# Patient Record
Sex: Male | Born: 1960
Health system: Southern US, Community
[De-identification: ages and names within clinical notes are randomized; demographics above are authoritative.]

## PROBLEM LIST (undated history)

## (undated) DIAGNOSIS — K635 Polyp of colon: Secondary | ICD-10-CM

## (undated) DIAGNOSIS — E669 Obesity, unspecified: Secondary | ICD-10-CM

## (undated) DIAGNOSIS — K219 Gastro-esophageal reflux disease without esophagitis: Secondary | ICD-10-CM

## (undated) DIAGNOSIS — E739 Lactose intolerance, unspecified: Secondary | ICD-10-CM

## (undated) DIAGNOSIS — T7840XA Allergy, unspecified, initial encounter: Secondary | ICD-10-CM

## (undated) HISTORY — DX: Obesity, unspecified: E66.9

## (undated) HISTORY — DX: Allergy, unspecified, initial encounter: T78.40XA

## (undated) HISTORY — PX: CERVICAL DISC SURGERY: SHX588

## (undated) HISTORY — DX: Polyp of colon: K63.5

## (undated) HISTORY — DX: Lactose intolerance, unspecified: E73.9

## (undated) HISTORY — PX: INGUINAL HERNIA REPAIR: SHX194

## (undated) HISTORY — PX: TOOTH EXTRACTION: SUR596

## (undated) HISTORY — DX: Gastro-esophageal reflux disease without esophagitis: K21.9

---

## 1996-10-29 HISTORY — PX: OTHER SURGICAL HISTORY: SHX169

## 2004-10-29 HISTORY — PX: LUMBAR DISC SURGERY: SHX700

## 2008-08-11 ENCOUNTER — Encounter: Payer: Self-pay | Admitting: Orthopedic Surgery

## 2008-12-08 ENCOUNTER — Ambulatory Visit: Payer: Self-pay | Admitting: Family Medicine

## 2008-12-08 DIAGNOSIS — M2390 Unspecified internal derangement of unspecified knee: Secondary | ICD-10-CM | POA: Insufficient documentation

## 2008-12-08 DIAGNOSIS — J309 Allergic rhinitis, unspecified: Secondary | ICD-10-CM | POA: Insufficient documentation

## 2008-12-24 ENCOUNTER — Ambulatory Visit: Payer: Self-pay | Admitting: Family Medicine

## 2008-12-28 ENCOUNTER — Ambulatory Visit: Payer: Self-pay | Admitting: Family Medicine

## 2008-12-28 LAB — CONVERTED CEMR LAB
Albumin: 4.2 g/dL (ref 3.5–5.2)
BUN: 14 mg/dL (ref 6–23)
Basophils Absolute: 0 10*3/uL (ref 0.0–0.1)
Basophils Relative: 0.7 % (ref 0.0–3.0)
Cholesterol: 170 mg/dL (ref 0–200)
Creatinine, Ser: 0.9 mg/dL (ref 0.4–1.5)
Eosinophils Absolute: 0.1 10*3/uL (ref 0.0–0.7)
Eosinophils Relative: 2.1 % (ref 0.0–5.0)
GFR calc Af Amer: 116 mL/min
GFR calc non Af Amer: 96 mL/min
HCT: 44.7 % (ref 39.0–52.0)
HDL: 47.2 mg/dL (ref >39.0)
MCHC: 33.6 g/dL (ref 30.0–36.0)
MCV: 89.2 fL (ref 78.0–100.0)
Monocytes Absolute: 0.5 10*3/uL (ref 0.1–1.0)
PSA: 2.08 ng/mL (ref 0.10–4.00)
Platelets: 208 10*3/uL (ref 150–400)
RBC: 5.02 M/uL (ref 4.22–5.81)
TSH: 1.38 microintl units/mL (ref 0.35–5.50)
WBC: 5.9 10*3/uL (ref 4.5–10.5)

## 2010-01-05 ENCOUNTER — Ambulatory Visit: Payer: Self-pay | Admitting: Family Medicine

## 2010-01-09 ENCOUNTER — Ambulatory Visit: Payer: Self-pay | Admitting: Family Medicine

## 2010-01-09 DIAGNOSIS — R5381 Other malaise: Secondary | ICD-10-CM | POA: Insufficient documentation

## 2010-01-09 DIAGNOSIS — R5383 Other fatigue: Secondary | ICD-10-CM

## 2010-01-10 LAB — CONVERTED CEMR LAB
AST: 20 units/L (ref 0–37)
Alkaline Phosphatase: 66 units/L (ref 39–117)
Basophils Absolute: 0 10*3/uL (ref 0.0–0.1)
Bilirubin, Direct: 0.1 mg/dL (ref 0.0–0.3)
Cholesterol: 155 mg/dL (ref 0–200)
GFR calc non Af Amer: 95.57 mL/min (ref >60)
HCT: 41.4 % (ref 39.0–52.0)
LDL Cholesterol: 82 mg/dL (ref 0–99)
Lymphs Abs: 1.9 10*3/uL (ref 0.7–4.0)
Monocytes Absolute: 0.6 10*3/uL (ref 0.1–1.0)
Monocytes Relative: 9.2 % (ref 3.0–12.0)
PSA: 2.03 ng/mL (ref 0.10–4.00)
Platelets: 184 10*3/uL (ref 150.0–400.0)
Potassium: 4.2 meq/L (ref 3.5–5.1)
RDW: 11.4 % — ABNORMAL LOW (ref 11.5–14.6)
Sodium: 141 meq/L (ref 135–145)
TSH: 1.79 microintl units/mL (ref 0.35–5.50)
Total Bilirubin: 0.5 mg/dL (ref 0.3–1.2)
VLDL: 22.6 mg/dL (ref 0.0–40.0)

## 2010-04-06 ENCOUNTER — Ambulatory Visit: Payer: Self-pay | Admitting: Family Medicine

## 2010-04-06 DIAGNOSIS — M7150 Other bursitis, not elsewhere classified, unspecified site: Secondary | ICD-10-CM

## 2010-04-06 DIAGNOSIS — M722 Plantar fascial fibromatosis: Secondary | ICD-10-CM | POA: Insufficient documentation

## 2010-04-18 ENCOUNTER — Ambulatory Visit: Payer: Self-pay | Admitting: Family Medicine

## 2010-11-28 NOTE — Assessment & Plan Note (Signed)
Summary: LOOK AT KNEE/DLO   Vital Signs:  Patient profile:   50 year old male Height:      71 inches Weight:      219.50 pounds BMI:     30.72 Temp:     98.8 degrees F oral Pulse rate:   92 / minute Pulse rhythm:   regular BP sitting:   116 / 70  (left arm) Cuff size:   large  Vitals Entered By: Delilah Shan CMA Duncan Dull) (April 06, 2010 12:08 PM) CC: Knee and heel pain (tennis injury)   History of Present Illness: 50 year old male:  Knee   Heel pain:  R knee: First week of May, hurt his right knee in her right knee. was hurting a lot on the inside.  Also, hurt bad a lot with bending his knee back a lot. Right knee is bending and hurting a lot   he does have an effusion, and continues to have some medial pain, this is much better over the last 1-2 weeks. He describes it at the joint line. He has been able to play tennis, including last night. He did play some golf as well. No mechanical symptoms. No locking up of this joint.  Heel, posterior on the left. for two monhs. deep ache, left-sided heel.  No specific injury or trauma.  during on ematch on the two months ago.  REVIEW OF SYSTEMS  GEN: No systemic complaints, no fevers, chills, sweats, or other acute illnesses MSK: Detailed in the HPI GI: tolerating PO intake without difficulty Neuro: No numbness, parasthesias, or tingling associated. Otherwise the pertinent positives of the ROS are noted above.      GEN: Well-developed,well-nourished,in no acute distress; alert,appropriate and cooperative throughout examination HEENT: Normocephalic and atraumatic without obvious abnormalities. No apparent alopecia or balding. Ears, externally no deformities PULM: Breathing comfortably in no respiratory distress EXT: No clubbing, cyanosis, or edema PSYCH: Normally interactive. Cooperative during the interview. Pleasant. Friendly and conversant. Not anxious or depressed appearing. Normal, full affect.   right knee: Mild to  moderate effusion. Full extension and flexion to 100.5. Tender on the medial, posterior joint line. Stable varus and valgus stress. Negative Lockman exam. Positive Bounce Home Test. The Murray's test is mildly painful, but no mechanical symptoms.  Left knee, grossly normal to examination with no meniscal examination findings.  Left foot and ankle: Nontender on all bony anatomy. There is some mild fullness on the  medial and lateral aspects  just anterior to the Achilles insertion. Nontender and no nodules present on the Achilles itself. There's also some mild tenderness at the plantar fascia insertion.  Allergies: 1)  ! * Some Antibiotic Eye Drops 2)  ! * Medical Tape Adhesive  Past History:  Past medical, surgical, family and social histories (including risk factors) reviewed, and no changes noted (except as noted below).  Past Medical History: Reviewed history from 12/08/2008 and no changes required. Obesity Allergic rhinitis  Derm = Armida Sans  Past Surgical History: Reviewed history from 12/08/2008 and no changes required. Inguinal herniorrhaphy, 1962 L4-5 Diskectomy, 2006 1st MTP surgery, 1998, R   Family History: Reviewed history from 12/08/2008 and no changes required. F, d/c brain tumor at 62 M, alive 74 PGF, CVA DM, GM HTN OA  Social History: Reviewed history from 12/08/2008 and no changes required. 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 He is not having any mechanical symptoms, and the patient is actually improving even with doing some rather extensive activity and playing tennis 3 times in the  last week.  At this point, he would like to follow this conservatively, continue with some routine icing and see how he does. If he does not do well.  I would aspirate and inject his knee.  He has been a followup in about a month if he continues to have difficulty and repetitive swelling after playing tennis and golf  Problem # 2:  BURSITIS, FOOT (ICD-727.3) left-sided retrocalcaneal bursitis.  Icing, strengthening, add Tuli's heel cups to tennis shoes  Problem # 3:  PLANTAR FASCIITIS, LEFT (ICD-728.71) Assessment: New mild PF on L  reviewed str, stretching, massage with rolling pin  Complete Medication List: 1)  Multivitamins Tabs (Multiple vitamin) .... Take one tablet once daily 2)  Fish Oil 1000 Mg Caps (Omega-3 fatty acids) .... Once daily  Patient Instructions: 1)  Retrocalcaneal bursitis: 2)  Tuli's heel cups  Current Allergies (reviewed today): ! * SOME ANTIBIOTIC EYE DROPS ! * MEDICAL TAPE ADHESIVE

## 2010-11-28 NOTE — Assessment & Plan Note (Signed)
Summary: CPX/CLE   Vital Signs:  Patient profile:   50 year old male Height:      71 inches Weight:      219.0 pounds BMI:     30.65 Temp:     98.4 degrees F oral Pulse rate:   72 / minute Pulse rhythm:   regular BP sitting:   130 / 82  (left arm) Cuff size:   regular  Vitals Entered By: Benny Lennert CMA Jason Coffey) (January 05, 2010 2:35 PM)  History of Present Illness: Chief complaint cpx  50 year old male:  Knee got better after injection    Preventive Screening-Counseling & Management  Alcohol-Tobacco     Alcohol drinks/day: 2     Alcohol type: beer     Smoking Status: quit     Year Quit: 1997     Pack years: 15     Passive Smoke Exposure: no     Tobacco Counseling: to remain off tobacco products  Caffeine-Diet-Exercise     Diet Comments: fairly good     Does Patient Exercise: yes     Type of exercise: Golf, tennis     Exercise (avg: min/session): >60     Times/week: 4  Hep-HIV-STD-Contraception     Testicular SE Education/Counseling to perform regular STE      Sexual History:  currently monogamous.        Drug Use:  never.    Clinical Review Panels:  Prevention   Last PSA:  2.08 (12/24/2008)  Immunizations   Last Tetanus Booster:  given (10/29/2004)  Lipid Management   Cholesterol:  170 (12/24/2008)   LDL (bad choesterol):  105 (12/24/2008)   HDL (good cholesterol):  47.2 (12/24/2008)  CBC   WBC:  5.9 (12/24/2008)   RBC:  5.02 (12/24/2008)   Hgb:  15.0 (12/24/2008)   Hct:  44.7 (12/24/2008)   Platelets:  208 (12/24/2008)   MCV  89.2 (12/24/2008)   MCHC  33.6 (12/24/2008)   RDW  11.7 (12/24/2008)   PMN:  58.5 (12/24/2008)   Lymphs:  30.5 (12/24/2008)   Monos:  8.2 (12/24/2008)   Eosinophils:  2.1 (12/24/2008)   Basophil:  0.7 (12/24/2008)  Complete Metabolic Panel   Glucose:  96 (12/24/2008)   Sodium:  138 (12/24/2008)   Potassium:  4.5 (12/24/2008)   Chloride:  102 (12/24/2008)   CO2:  28 (12/24/2008)   BUN:  14 (12/24/2008)  Creatinine:  0.9 (12/24/2008)   Albumin:  4.2 (12/24/2008)   Total Protein:  7.0 (12/24/2008)   Calcium:  9.6 (12/24/2008)   Total Bili:  0.8 (12/24/2008)   Alk Phos:  58 (12/24/2008)   SGPT (ALT):  27 (12/24/2008)   SGOT (AST):  19 (12/24/2008)   Allergies: 1)  ! * Some Antibiotic Eye Drops 2)  ! * Medical Tape Adhesive  Past History:  Past medical, surgical, family and social histories (including risk factors) reviewed, and no changes noted (except as noted below).  Past Medical History: Reviewed history from 12/08/2008 and no changes required. Obesity Allergic rhinitis  Derm = Armida Sans  Past Surgical History: Reviewed history from 12/08/2008 and no changes required. Inguinal herniorrhaphy, 1962 L4-5 Diskectomy, 2006 1st MTP surgery, 1998, R  Family History: Reviewed history from 12/08/2008 and no changes required. F, d/c brain tumor at 73 M, alive 64 PGF, CVA DM, GM HTN OA  Social History: Reviewed history from 12/08/2008 and no changes required. 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  Cardiovascular: Denies chest pains, palpitations, syncope, dyspnea on exertion,  Respiratory: Denies cough, dyspnea at rest, excessive sputum,wheeezing GI: Denies nausea, vomiting, diarrhea, constipation, change in bowel habits, abdominal pain, melena, hematochezia GU: Denies dysuria, hematuria, discharge, urinary frequency, urinary hesitancy, nocturia, incontinence, genital sores, decreased libido Musculoskeletal: Denies back  pain, joint pain Derm: Denies rash, itching Neuro: Denies  paresthesias, frequent falls, frequent headaches, and difficulty walking.  Psych: Denies depression, anxiety Endocrine: Denies cold intolerance, heat intolerance, polydipsia, polyphagia, polyuria, and unusual weight change.  Heme: Denies enlarged lymph nodes Allergy: No hayfever   Otherwise, the pertinent positives and negatives are listed above and in the HPI, otherwise a full review of systems has been reviewed and is negative unless noted positive.   Physical Exam  General:  Well-developed,well-nourished,in no acute distress; alert,appropriate and cooperative throughout examination Head:  Normocephalic and atraumatic without obvious abnormalities. No apparent alopecia or balding. Eyes:  No corneal or conjunctival inflammation noted. EOMI. Perrla. Funduscopic exam benign, without hemorrhages, exudates or papilledema. Vision grossly normal. Ears:  External ear exam shows no significant lesions or deformities.  Otoscopic examination reveals clear canals, tympanic membranes are intact bilaterally without bulging, retraction, inflammation or discharge. Hearing is grossly normal bilaterally. Nose:  External nasal examination shows no deformity or inflammation. Nasal mucosa are pink and moist without lesions or exudates. Mouth:  Oral mucosa and oropharynx without lesions or exudates.  Teeth in good repair. Neck:  No deformities, masses, or tenderness noted. Chest Wall:  No deformities, masses, tenderness or gynecomastia noted. Lungs:  Normal respiratory effort, chest expands symmetrically. Lungs are clear to auscultation, no crackles or wheezes. Heart:  Normal rate and regular rhythm. S1 and S2 normal without gallop, murmur, click, rub or other extra sounds. Abdomen:  Bowel sounds positive,abdomen soft and non-tender without masses, organomegaly or hernias noted. Rectal:  internal hemorrhoid, normal sphincter tone and no masses.     Genitalia:  Testes bilaterally descended without nodularity, tenderness or masses. No scrotal masses or lesions. No penis lesions or urethral discharge. Prostate:  Prostate gland firm and smooth, no enlargement, nodularity, tenderness, mass, asymmetry or induration. Msk:  normal ROM and no crepitation.   Extremities:  No clubbing, cyanosis, edema, or deformity noted with normal full range of motion of all joints.   Neurologic:  alert & oriented X3, sensation intact to light touch, and gait normal.   Skin:  Intact without suspicious lesions or rashes Cervical Nodes:  No lymphadenopathy noted Psych:  Cognition and judgment appear intact. Alert and cooperative with normal attention span and concentration. No apparent delusions, illusions, hallucinations   Impression & Recommendations:  Problem # 1:  HEALTH MAINTENANCE EXAM (ICD-V70.0) The patient's preventative maintenance and recommended screening tests for an annual wellness exam were reviewed in full today. Brought up to date unless services declined.  Counselled on the importance of diet, exercise, and its role in overall health and mortality. The patient's FH and SH was reviewed, including their home life, tobacco status, and drug and alcohol status.   Complete Medication List: 1)  Multivitamins Tabs (Multiple vitamin) .... Take one tablet once daily 2)  Fish Oil 1000 Mg Caps (Omega-3 fatty acids) .... Once  daily  Patient Instructions: 1)  Prephysical Labs, several days before, fasting 2)  BMP, HFP, FLP, CBC with diff, TSH, PSA: v77.91, v77.1, ,780.79, v76.44   Current Allergies (reviewed today): ! * SOME ANTIBIOTIC EYE DROPS ! * MEDICAL TAPE ADHESIVE

## 2010-11-28 NOTE — Assessment & Plan Note (Signed)
Summary: RIGHT KNEE PAIN/CLE   Vital Signs:  Patient profile:   50 year old male Height:      71 inches Weight:      219.0 pounds BMI:     30.65 Temp:     99.1 degrees F oral Pulse rate:   88 / minute Pulse rhythm:   regular BP sitting:   120 / 86  (left arm) Cuff size:   large  Vitals Entered By: Benny Lennert CMA Duncan Dull) (April 18, 2010 9:58 AM) CC: right knee pain   History of Present Illness: 50 year old male:  f/u R knee: First week of May, hurt his right knee in her right knee. was hurting a lot on the inside.  Also, hurt bad a lot with bending his knee back a lot. Right knee is bending and hurting a lot   he does have an effusion, and continues to have some medial pain, has worsened again over the last couple of weeks. He describes it at the joint line. He has been able to play tennis, including last night. He did play some golf as well. Occ clicking. No locking up of this joint.  REVIEW OF SYSTEMS  GEN: No systemic complaints, no fevers, chills, sweats, or other acute illnesses MSK: Detailed in the HPI GI: tolerating PO intake without difficulty Neuro: No numbness, parasthesias, or tingling associated. Otherwise the pertinent positives of the ROS are noted above.      Allergies: 1)  ! * Some Antibiotic Eye Drops 2)  ! * Medical Tape Adhesive  Physical Exam  General:  Well-developed,well-nourished,in no acute distress; alert,appropriate and cooperative throughout examination Msk:  right knee: Mild to moderate effusion. Full extension and flexion to 120. Tender on the medial, posterior joint line. Stable varus and valgus stress. Negative Lockman exam. Negative Bounce Home Test. The Mcmurray's test is mildly painful, but no mechanical symptoms.   Impression & Recommendations:  Problem # 1:  INTERNAL DERANGEMENT, RIGHT KNEE (ICD-717.9)  Probable medial meniscal tear.  discussion with patient about potential treatment options, conservative vs. more  aggressive, and patient wants to proceed with trial of injection, nsaids, ice -- prior to consideration of diagnostic imaging vs. any operative interventions  Knee Injection Patient verbally consented to procedure. Risks, benefits, and alternatives explained. Sterilely prepped with betadine. Ethyl cholride used for anesthesia. 9 cc 1% Lidocaine mixed with 1 cc of Kenalog 40 mg injected using the anterolateral approach without difficulty. No complications with procedure and tolerated well. Patient had decreased pain post-injection.   Orders: Joint Aspirate / Injection, Large (20610) Kenalog 10mg  (4units) (J3301)  Complete Medication List: 1)  Multivitamins Tabs (Multiple vitamin) .... Take one tablet once daily 2)  Fish Oil 1000 Mg Caps (Omega-3 fatty acids) .... Once daily  Current Allergies (reviewed today): ! * SOME ANTIBIOTIC EYE DROPS ! * MEDICAL TAPE ADHESIVE

## 2011-04-10 ENCOUNTER — Other Ambulatory Visit: Payer: Self-pay | Admitting: Family Medicine

## 2011-04-10 DIAGNOSIS — Z125 Encounter for screening for malignant neoplasm of prostate: Secondary | ICD-10-CM

## 2011-04-10 DIAGNOSIS — R5381 Other malaise: Secondary | ICD-10-CM

## 2011-04-10 DIAGNOSIS — Z13220 Encounter for screening for lipoid disorders: Secondary | ICD-10-CM

## 2011-04-16 ENCOUNTER — Other Ambulatory Visit (INDEPENDENT_AMBULATORY_CARE_PROVIDER_SITE_OTHER): Payer: 59 | Admitting: Family Medicine

## 2011-04-16 DIAGNOSIS — Z125 Encounter for screening for malignant neoplasm of prostate: Secondary | ICD-10-CM

## 2011-04-16 DIAGNOSIS — R4182 Altered mental status, unspecified: Secondary | ICD-10-CM

## 2011-04-16 DIAGNOSIS — R5383 Other fatigue: Secondary | ICD-10-CM

## 2011-04-16 DIAGNOSIS — R5381 Other malaise: Secondary | ICD-10-CM

## 2011-04-16 DIAGNOSIS — Z13220 Encounter for screening for lipoid disorders: Secondary | ICD-10-CM

## 2011-04-16 LAB — COMPREHENSIVE METABOLIC PANEL
BUN: 15 mg/dL (ref 6–23)
CO2: 25 mEq/L (ref 19–32)
Calcium: 9.1 mg/dL (ref 8.4–10.5)
Chloride: 98 mEq/L (ref 96–112)
Creatinine, Ser: 1 mg/dL (ref 0.4–1.5)
GFR: 89.32 mL/min (ref >60.00)
Glucose, Bld: 90 mg/dL (ref 70–99)

## 2011-04-16 LAB — LDL CHOLESTEROL, DIRECT: Direct LDL: 116.4 mg/dL

## 2011-04-16 LAB — CBC WITH DIFFERENTIAL/PLATELET
Basophils Absolute: 0 10*3/uL (ref 0.0–0.1)
Eosinophils Absolute: 0.1 10*3/uL (ref 0.0–0.7)
Lymphocytes Relative: 30.7 % (ref 12.0–46.0)
MCHC: 34.1 g/dL (ref 30.0–36.0)
Monocytes Relative: 8.4 % (ref 3.0–12.0)
Platelets: 209 10*3/uL (ref 150.0–400.0)
RDW: 12.2 % (ref 11.5–14.6)

## 2011-04-16 LAB — LIPID PANEL
Cholesterol: 204 mg/dL — ABNORMAL HIGH (ref 0–200)
VLDL: 28.8 mg/dL (ref 0.0–40.0)

## 2011-04-16 LAB — PSA: PSA: 2.5 ng/mL (ref 0.10–4.00)

## 2011-04-21 ENCOUNTER — Encounter: Payer: Self-pay | Admitting: Family Medicine

## 2011-04-26 ENCOUNTER — Ambulatory Visit (INDEPENDENT_AMBULATORY_CARE_PROVIDER_SITE_OTHER): Payer: 59 | Admitting: Family Medicine

## 2011-04-26 ENCOUNTER — Encounter: Payer: Self-pay | Admitting: Family Medicine

## 2011-04-26 VITALS — BP 120/78 | HR 80 | Temp 98.4°F | Ht 70.0 in | Wt 223.4 lb

## 2011-04-26 DIAGNOSIS — Z0000 Encounter for general adult medical examination without abnormal findings: Secondary | ICD-10-CM

## 2011-04-26 DIAGNOSIS — M722 Plantar fascial fibromatosis: Secondary | ICD-10-CM

## 2011-04-26 DIAGNOSIS — N529 Male erectile dysfunction, unspecified: Secondary | ICD-10-CM

## 2011-04-26 MED ORDER — SILDENAFIL CITRATE 100 MG PO TABS: 100.0000 mg | ORAL_TABLET | ORAL | Status: AC | PRN

## 2011-04-26 NOTE — Patient Instructions (Signed)
Plantar fascia - partial tear NO AGGRESSIVE STRETCHING  Avoid ballistic movements like jumping, playing tennis for the next month OK to bike, swim, lift weight if it doesn't hurt.  Good heel cushion Arch strap when on feet Bucket of ice x 15 minutes, 2-3 times a day  After a month, then start stretching and strengthening program  STRETCHING and Strengthening program critically important.  Calf raises, 2 legged, then 1 legged. Foot massage with tennis ball. Ice massage.  NEEDS TO BE DONE EVERY DAY(AFTER 1 MONTH)  Recommended over the counter insoles. (Spenco or Hapad)  A rigid shoe with good arch support helps: Dansko (great), Randel Pigg, Merrell No easily bendable shoes.

## 2011-04-26 NOTE — Progress Notes (Signed)
Jason Coffey, a 50 y.o. male presents today in the office for the following:   Health maintenance examination, and additionally he has several other acute complaints.  Erectile dysfunction. Erectile quality not as good. He is able to achieve an erection most of the time, over the quality is not quite as good, he'll often loose his erection. He is able to achieve intercourse, but does have some difficulty sometimes in the middle.  PF - historically, he has had some problems with chronic plantar fasciotomy over the last few years. This has been just some mild pain rarely with a great deal of exertion. Approximately one month ago, the patient was playing in a tennis tournament and played several canes, and then felt some acute sudden and severe pain on the plantar aspect of his heel. This is at the heel directly. He has had no bruising or swelling. Continues to be severely painful. He has some good over-the-counter insoles as well as a heel cup. In the past few weeks, he did see one of the podiatrists, and had a corticosteroid injection at the heel, which provided some relief only for about one day.  Preventative Health Maintenance Visit:  Health Maintenance Summary Reviewed and updated, unless pt declines services.  Alcohol: No concerns, no excessive use Exercise Habits: Some activity, rec at least 30 mins 5 times a week (very good until foot injured) STD concerns: no risk or activity to increase risk Drug Use: None Encouraged self-testicular check  Health Maintenance  Topic Date Due  . Tetanus/tdap  10/29/2014    Labs reviewed with the patient.   Lipids:    Component Value Date/Time   CHOL 204* 04/16/2011 0833   TRIG 144.0 04/16/2011 0833   HDL 55.60 04/16/2011 0833   LDLDIRECT 116.4 04/16/2011 0833   VLDL 28.8 04/16/2011 0833   CHOLHDL 4 04/16/2011 0833    CBC:    Component Value Date/Time   WBC 6.2 04/16/2011 0833   HGB 14.8 04/16/2011 0833   HCT 43.3 04/16/2011 0833   PLT 209.0  04/16/2011 0833   MCV 89.5 04/16/2011 0833   NEUTROABS 3.6 04/16/2011 0833   LYMPHSABS 1.9 04/16/2011 0833   MONOABS 0.5 04/16/2011 0833   EOSABS 0.1 04/16/2011 0833   BASOSABS 0.0 04/16/2011 0833    Basic Metabolic Panel:    Component Value Date/Time   NA 140 04/16/2011 0833   K 4.7 04/16/2011 0833   CL 98 04/16/2011 0833   CO2 25 04/16/2011 0833   BUN 15 04/16/2011 0833   CREATININE 1.0 04/16/2011 0833   GLUCOSE 90 04/16/2011 0833   CALCIUM 9.1 04/16/2011 0833    Lab Results  Component Value Date   ALT 29 04/16/2011   AST 18 04/16/2011   ALKPHOS 59 04/16/2011   BILITOT 0.7 04/16/2011    Lab Results  Component Value Date   PSA 2.50 04/16/2011   PSA 2.03 01/09/2010   PSA 2.08 12/24/2008   General: Denies fever, chills, sweats. No significant weight loss. Eyes: Denies blurring,significant itching ENT: Denies earache, sore throat, and hoarseness. Cardiovascular: Denies chest pains, palpitations, dyspnea on exertion Respiratory: Denies cough, dyspnea at rest,wheeezing Breast: no concerns about lumps GI: Denies nausea, vomiting, diarrhea, constipation, change in bowel habits, abdominal pain, melena, hematochezia GU: Denies penile discharge, ED C/O NOTED, urinary flow / outflow problems. No STD concerns. Musculoskeletal: AS ABOVE Derm: Denies rash, itching Neuro: Denies  paresthesias, frequent falls, frequent headaches Psych: Denies depression, anxiety Endocrine: Denies cold intolerance, heat intolerance, polydipsia Heme: Denies  enlarged lymph nodes Allergy: No hayfever   Physical Exam  Blood pressure 120/78, pulse 80, temperature 98.4 F (36.9 C), temperature source Oral, height 5\' 10"  (1.778 m), weight 223 lb 6.4 oz (101.334 kg), SpO2 98.00%.  PE: GEN: well developed, well nourished, no acute distress Eyes: conjunctiva and lids normal, PERRLA, EOMI ENT: TM clear, nares clear, oral exam WNL Neck: supple, no lymphadenopathy, no thyromegaly, no JVD Pulm: clear to auscultation and  percussion, respiratory effort normal CV: regular rate and rhythm, S1-S2, no murmur, rub or gallop, no bruits, peripheral pulses normal and symmetric, no cyanosis, clubbing, edema or varicosities Chest: no scars, masses, no gynecomastia   GI: soft, non-tender; no hepatosplenomegaly, masses; active bowel sounds all quadrants GU: no hernia, testicular mass, penile discharge, priapism or prostate enlargement Lymph: no cervical, axillary or inguinal adenopathy MSK: gait normal, muscle tone and strength WNL, no joint swelling, effusions, discoloration, crepitus  Echymosis: no Edema: no ROM: full LE B Gait: heel toe, non-antalgic MT pain: no Callus pattern: none Lateral Mall: NT Medial Mall: NT Talus: NT Navicular: NT Calcaneous: NT Metatarsals: NT 5th MT: NT Phalanges: NT Achilles: NT Plantar Fascia: tender, medial along PF. Pain with forced dorsi - notably directly at calcaneus Fat Pad: NT Peroneals: NT Post Tib: NT Great Toe: Nml motion Ant Drawer: neg Sensation: intact  SKIN: clear, good turgor, color WNL, no rashes, lesions, or ulcerations Neuro: normal mental status, normal strength, sensation, and motion Psych: alert; oriented to person, place and time, normally interactive and not anxious or depressed in appearance.  A/P: HM: The patient's preventative maintenance and recommended screening tests for an annual wellness exam were reviewed in full today. Brought up to date unless services declined.  Counselled on the importance of diet, exercise, and its role in overall health and mortality. The patient's FH and SH was reviewed, including their home life, tobacco status, and drug and alcohol status.  Overall, doing well, recommended weight loss, increase fitness.  2. Plantar fasciotomy, probable left-sided partial thickness insertional rupture. Altered rehabilitation. No aggressive stretching over the next few weeks. Get an arch binder. Reviewed some strengthening if he can  do as well after about the next 3-4 weeks. No ballistic movements.  3 erectile dysfunction, new: Trial of Viagra.

## 2011-04-27 DIAGNOSIS — N529 Male erectile dysfunction, unspecified: Secondary | ICD-10-CM | POA: Insufficient documentation

## 2012-07-18 ENCOUNTER — Other Ambulatory Visit: Payer: Self-pay | Admitting: Family Medicine

## 2012-07-18 DIAGNOSIS — R5383 Other fatigue: Secondary | ICD-10-CM

## 2012-07-18 DIAGNOSIS — Z13220 Encounter for screening for lipoid disorders: Secondary | ICD-10-CM

## 2012-07-18 DIAGNOSIS — Z125 Encounter for screening for malignant neoplasm of prostate: Secondary | ICD-10-CM

## 2012-07-22 ENCOUNTER — Other Ambulatory Visit (INDEPENDENT_AMBULATORY_CARE_PROVIDER_SITE_OTHER): Payer: 59

## 2012-07-22 DIAGNOSIS — R5383 Other fatigue: Secondary | ICD-10-CM

## 2012-07-22 DIAGNOSIS — Z13220 Encounter for screening for lipoid disorders: Secondary | ICD-10-CM

## 2012-07-22 DIAGNOSIS — R5381 Other malaise: Secondary | ICD-10-CM

## 2012-07-22 DIAGNOSIS — Z125 Encounter for screening for malignant neoplasm of prostate: Secondary | ICD-10-CM

## 2012-07-22 LAB — CBC WITH DIFFERENTIAL/PLATELET
Basophils Absolute: 0 10*3/uL (ref 0.0–0.1)
Basophils Relative: 0.7 % (ref 0.0–3.0)
Eosinophils Absolute: 0.2 10*3/uL (ref 0.0–0.7)
Hemoglobin: 14 g/dL (ref 13.0–17.0)
Lymphocytes Relative: 33.6 % (ref 12.0–46.0)
MCHC: 33.3 g/dL (ref 30.0–36.0)
MCV: 88.2 fl (ref 78.0–100.0)
Monocytes Absolute: 0.5 10*3/uL (ref 0.1–1.0)
Neutro Abs: 3.7 10*3/uL (ref 1.4–7.7)
Neutrophils Relative %: 55.6 % (ref 43.0–77.0)
RDW: 12.7 % (ref 11.5–14.6)

## 2012-07-22 LAB — BASIC METABOLIC PANEL
BUN: 12 mg/dL (ref 6–23)
Chloride: 103 mEq/L (ref 96–112)
Creatinine, Ser: 0.9 mg/dL (ref 0.4–1.5)
GFR: 99.68 mL/min (ref >60.00)

## 2012-07-22 LAB — HEPATIC FUNCTION PANEL
ALT: 23 U/L (ref 0–53)
Alkaline Phosphatase: 51 U/L (ref 39–117)
Bilirubin, Direct: 0.1 mg/dL (ref 0.0–0.3)
Total Bilirubin: 1 mg/dL (ref 0.3–1.2)
Total Protein: 6.3 g/dL (ref 6.0–8.3)

## 2012-07-22 LAB — PSA: PSA: 2.31 ng/mL (ref 0.10–4.00)

## 2012-07-22 LAB — LIPID PANEL: Cholesterol: 178 mg/dL (ref 0–200)

## 2012-07-26 ENCOUNTER — Encounter: Payer: Self-pay | Admitting: Family Medicine

## 2012-07-28 ENCOUNTER — Encounter: Payer: Self-pay | Admitting: Family Medicine

## 2012-07-28 ENCOUNTER — Ambulatory Visit (INDEPENDENT_AMBULATORY_CARE_PROVIDER_SITE_OTHER): Payer: 59 | Admitting: Family Medicine

## 2012-07-28 ENCOUNTER — Encounter: Payer: Self-pay | Admitting: Internal Medicine

## 2012-07-28 VITALS — BP 124/76 | HR 80 | Temp 98.7°F | Resp 20 | Ht 69.75 in | Wt 226.5 lb

## 2012-07-28 DIAGNOSIS — Z1211 Encounter for screening for malignant neoplasm of colon: Secondary | ICD-10-CM

## 2012-07-28 DIAGNOSIS — Z0000 Encounter for general adult medical examination without abnormal findings: Secondary | ICD-10-CM

## 2012-07-28 NOTE — Progress Notes (Signed)
[facility] [address] [place] [address] Phone: [phone] Fax: [phone]  Date:  07/28/2012   Name:  Jason Coffey   DOB:  May 17, 1961   MRN:  [id number] Gender: male Age: 51 y.o.  PCP:  Hannah Beat, MD    Chief Complaint: Annual Exam   History of Present Illness:  Sara Selvidge is a 51 y.o. pleasant patient who presents with the following:  Playing tennis 3-4 times a week.  Preventative Health Maintenance Visit:  Health Maintenance Summary Reviewed and updated, unless pt declines services.  Tobacco History Reviewed. Alcohol: stopped beer, rare ETOH Exercise Habits: tennis 3-4 days a week STD concerns: no risk or activity to increase risk Drug Use: None Encouraged self-testicular check  Health Maintenance  Topic Date Due  . Colonoscopy  08/26/2011  . Influenza Vaccine  06/29/2012  . Tetanus/tdap  10/29/2014    Labs reviewed with the patient.  Results for orders placed in visit on 07/22/12  LIPID PANEL      Component Value Range   Cholesterol 178  0 - 200 mg/dL   Triglycerides 86.5  0.0 - 149.0 mg/dL   HDL 78.46  >96.29 mg/dL   VLDL 52.8  0.0 - 41.3 mg/dL   LDL Cholesterol 244 (*) 0 - 99 mg/dL   Total CHOL/HDL Ratio 4    HEPATIC FUNCTION PANEL      Component Value Range   Total Bilirubin 1.0  0.3 - 1.2 mg/dL   Bilirubin, Direct 0.1  0.0 - 0.3 mg/dL   Alkaline Phosphatase 51  39 - 117 U/L   AST 19  0 - 37 U/L   ALT 23  0 - 53 U/L   Total Protein 6.3  6.0 - 8.3 g/dL   Albumin 3.9  3.5 - 5.2 g/dL  CBC WITH DIFFERENTIAL      Component Value Range   WBC 6.6  4.5 - 10.5 K/uL   RBC 4.76  4.22 - 5.81 Mil/uL   Hemoglobin 14.0  13.0 - 17.0 g/dL   HCT 01.0  27.2 - 53.6 %   MCV 88.2  78.0 - 100.0 fl   MCHC 33.3  30.0 - 36.0 g/dL   RDW 64.4  03.4 - 74.2 %   Platelets 215.0  150.0 - 400.0 K/uL   Neutrophils Relative 55.6  43.0 - 77.0 %   Lymphocytes Relative 33.6  12.0 - 46.0 %   Monocytes Relative 7.5  3.0 - 12.0 %   Eosinophils Relative 2.6  0.0 - 5.0 %   Basophils Relative 0.7  0.0 - 3.0 %   Neutro Abs 3.7  1.4 - 7.7 K/uL   Lymphs Abs 2.2  0.7 - 4.0 K/uL   Monocytes Absolute 0.5  0.1 - 1.0 K/uL   Eosinophils Absolute 0.2  0.0 - 0.7 K/uL   Basophils Absolute 0.0  0.0 - 0.1 K/uL  BASIC METABOLIC PANEL      Component Value Range   Sodium 142  135 - 145 mEq/L   Potassium 4.3  3.5 - 5.1 mEq/L   Chloride 103  96 - 112 mEq/L   CO2 25  19 - 32 mEq/L   Glucose, Bld 93  70 - 99 mg/dL   BUN 12  6 - 23 mg/dL   Creatinine, Ser 0.9  0.4 - 1.5 mg/dL   Calcium 9.4  8.4 - 59.5 mg/dL   GFR 63.87  >56.43 mL/min  PSA      Component Value Range  PSA 2.31  0.10 - 4.00 ng/mL     Patient Active Problem List  Diagnosis  . ALLERGIC RHINITIS  . PLANTAR FASCIITIS, LEFT  . OTHER MALAISE AND FATIGUE  . Routine general medical examination at a health care facility  . Erectile dysfunction    Past Medical History  Diagnosis Date  . Obesity   . Allergic rhinitis     Past Surgical History  Procedure Date  . Inguinal hernia repair 1962  . Lumbar disc surgery 2006    L4-5  . Mtp surgery 1998    1st, right    History  Substance Use Topics  . Smoking status: Former Smoker    Quit date: 10/30/1987  . Smokeless tobacco: Not on file   Comment: 15 pack year  . Alcohol Use: Yes     12/18 beers a week    Family History  Problem Relation Age of Onset  . Stroke Paternal Grandfather     No Known Allergies  Medication list has been reviewed and updated.  Outpatient Prescriptions Prior to Visit  Medication Sig Dispense Refill  . ibuprofen (ADVIL,MOTRIN) 200 MG tablet Take 400 mg by mouth every 6 (six) hours as needed.        . Multiple Vitamin (MULTIVITAMIN) tablet Take 1 tablet by mouth daily.        . Omega-3 Fatty Acids (FISH OIL) 1000 MG CAPS Take by mouth daily.          Review of Systems:   General: Denies fever, chills, sweats. No significant weight loss. Eyes: Denies blurring,significant  itching ENT: Denies earache, sore throat, and hoarseness. Cardiovascular: Denies chest pains, palpitations, dyspnea on exertion Respiratory: Denies cough, dyspnea at rest,wheeezing Breast: no concerns about lumps GI: Denies nausea, vomiting, diarrhea, constipation, change in bowel habits, abdominal pain, melena, hematochezia GU: Denies penile discharge, sometimes ED, no urinary flow / outflow problems. No STD concerns. Musculoskeletal: Denies back pain, joint pain Derm: Denies rash, itching Neuro: Denies  paresthesias, frequent falls, frequent headaches Psych: Denies depression, anxiety Endocrine: Denies cold intolerance, heat intolerance, polydipsia Heme: Denies enlarged lymph nodes Allergy: No hayfever  Physical Examination: Filed Vitals:   07/28/12 1440  BP: 124/76  Pulse: 80  Temp: 98.7 F (37.1 C)  Resp: 20   Filed Vitals:   07/28/12 1440  Height: 5' 9.75" (1.772 m)  Weight: 226 lb 8 oz (102.74 kg)   Body mass index is 32.73 kg/(m^2). Ideal Body Weight: Weight in (lb) to have BMI = 25: 172.6    Wt Readings from Last 3 Encounters:  07/28/12 226 lb 8 oz (102.74 kg)  04/26/11 223 lb 6.4 oz (101.334 kg)  04/18/10 219 lb (99.338 kg)    GEN: well developed, well nourished, no acute distress Eyes: conjunctiva and lids normal, PERRLA, EOMI ENT: TM clear, nares clear, oral exam WNL Neck: supple, no lymphadenopathy, no thyromegaly, no JVD Pulm: clear to auscultation and percussion, respiratory effort normal CV: regular rate and rhythm, S1-S2, no murmur, rub or gallop, no bruits, peripheral pulses normal and symmetric, no cyanosis, clubbing, edema or varicosities Chest: no scars, masses GI: soft, non-tender; no hepatosplenomegaly, masses; active bowel sounds all quadrants GU: no hernia, testicular mass, penile discharge, or prostate enlargement Lymph: no cervical, axillary or inguinal adenopathy MSK: gait normal, muscle tone and strength WNL, no joint swelling, effusions,  discoloration, crepitus  SKIN: clear, good turgor, color WNL, no rashes, lesions, or ulcerations Neuro: normal mental status, normal strength, sensation, and motion Psych: alert; oriented to  person, place and time, normally interactive and not anxious or depressed in appearance.  Assessment and Plan:  1. Routine general medical examination at a health care facility    2. Special screening for malignant neoplasm of colon  Ambulatory referral to Gastroenterology   The patient's preventative maintenance and recommended screening tests for an annual wellness exam were reviewed in full today. Brought up to date unless services declined.  Counselled on the importance of diet, exercise, and its role in overall health and mortality. The patient's FH and SH was reviewed, including their home life, tobacco status, and drug and alcohol status.   Overall, doing great with no c/o Work on [employer]  Orders Today:  Orders Placed This Encounter  Procedures  . Ambulatory referral to Gastroenterology    Referral Priority:  Routine    Referral Type:  Consultation    Referral Reason:  Specialty Services Required    Requested Specialty:  Gastroenterology    Number of Visits Requested:  1    Updated Medication List: (Includes new medications, updates to list, dose adjustments) No orders of the defined types were placed in this encounter.    Medications Discontinued: There are no discontinued medications.   Hannah Beat, MD

## 2012-07-28 NOTE — Patient Instructions (Addendum)
REFERRAL: GO THE THE FRONT ROOM AT THE ENTRANCE OF OUR CLINIC, NEAR CHECK IN. ASK FOR Jason Coffey. SHE WILL HELP YOU SET UP YOUR REFERRAL. DATE: TIME:  

## 2012-08-18 ENCOUNTER — Ambulatory Visit (AMBULATORY_SURGERY_CENTER): Payer: 59 | Admitting: *Deleted

## 2012-08-18 VITALS — Ht 70.0 in | Wt 225.0 lb

## 2012-08-18 DIAGNOSIS — Z1211 Encounter for screening for malignant neoplasm of colon: Secondary | ICD-10-CM

## 2012-08-18 MED ORDER — NA SULFATE-K SULFATE-MG SULF 17.5-3.13-1.6 GM/177ML PO SOLN: ORAL | Status: DC

## 2012-09-01 ENCOUNTER — Encounter: Payer: Self-pay | Admitting: Internal Medicine

## 2012-09-01 ENCOUNTER — Ambulatory Visit (AMBULATORY_SURGERY_CENTER): Payer: 59 | Admitting: Internal Medicine

## 2012-09-01 VITALS — BP 113/76 | HR 62 | Temp 98.0°F | Resp 16 | Ht 70.0 in | Wt 225.0 lb

## 2012-09-01 DIAGNOSIS — D126 Benign neoplasm of colon, unspecified: Secondary | ICD-10-CM

## 2012-09-01 DIAGNOSIS — Z1211 Encounter for screening for malignant neoplasm of colon: Secondary | ICD-10-CM

## 2012-09-01 DIAGNOSIS — K635 Polyp of colon: Secondary | ICD-10-CM

## 2012-09-01 MED ORDER — SODIUM CHLORIDE 0.9 % IV SOLN: 500.0000 mL | INTRAVENOUS | Status: DC

## 2012-09-01 NOTE — Op Note (Signed)
[facility] [address] [place], [address]   COLONOSCOPY PROCEDURE REPORT  PATIENT: Jason Coffey, Jason Coffey  MR#: [id number] BIRTHDATE: 1961/01/24 , 51  yrs. old GENDER: Male ENDOSCOPIST: Beverley Fiedler, MD REFERRED [id number], Elpidio Galea. PROCEDURE DATE:  09/01/2012 PROCEDURE:   Colonoscopy with snare polypectomy and Colonoscopy with cold biopsy polypectomy ASA CLASS:   Class I INDICATIONS:average risk screening and first colonoscopy. MEDICATIONS: MAC sedation, administered by CRNA and propofol (Diprivan) 400mg  IV  DESCRIPTION OF PROCEDURE:   After the risks benefits and alternatives of the procedure were thoroughly explained, informed consent was obtained.  A digital rectal exam revealed no rectal mass.   The LB [id number]  endoscope was introduced through the anus and advanced to the cecum, which was identified by both the appendix and ileocecal valve. No adverse events experienced. The quality of the prep was Suprep fair  The instrument was then slowly withdrawn as the colon was fully examined.   COLON FINDINGS: A sessile polyp measuring 6 mm in size was found at the cecum.  A polypectomy was performed with a cold snare.  The resection was complete and the polyp tissue was completely retrieved.   A sessile polyp measuring 10 mm in size was found in the ascending colon.  A polypectomy was performed using snare cautery.  The resection was complete and the polyp tissue was completely retrieved.   Three sessile polyps ranging between 3-18mm in size were found in the transverse colon and descending colon. Polypectomy was performed using cold snare.  All resections were complete and all polyp tissue was completely retrieved.   Five polyps ranging between 3-35mm in size were found in the sigmoid colon and rectum.  Polypectomy was performed using cold snare (2) and with cold forceps (3).  All resections were complete and all polyp tissue was completely retrieved.   Retroflexed views revealed no abnormalities. The time to cecum=1 minutes 40 seconds. Withdrawal time=25 minutes 45 seconds.  The scope was withdrawn and the procedure completed. COMPLICATIONS: There were no complications.  ENDOSCOPIC IMPRESSION: 1.   Sessile polyp measuring 6 mm in size was found at the cecum; polypectomy was performed with a cold snare 2.   Sessile polyp measuring 10 mm in size was found in the ascending colon; polypectomy was performed using snare cautery 3.   Three sessile polyps ranging between 3-105mm in size were found in the transverse colon and descending colon; Polypectomy was performed using cold snare 4.   Five polyps ranging between 3-9mm in size were found in the sigmoid colon and rectum; Polypectomy was performed using cold snare and with cold forceps     RECOMMENDATIONS: 1.  Hold aspirin, aspirin products, and anti-inflammatory medication for 1 week. 2.  Await pathology results 3.  Timing of repeat colonoscopy will be determined by pathology findings. 4.  You will receive a letter within 1-2 weeks with the results of your biopsy as well as final recommendations.  Please call my office if you have not received a letter after 3 weeks.   eSigned:  Beverley Fiedler, MD 09/01/2012 11:31 AM   cc: Juleen China, MD and The Patient   PATIENT NAME:  Jason Coffey, Jason Coffey MR#: [id number]

## 2012-09-01 NOTE — Patient Instructions (Addendum)

## 2012-09-01 NOTE — Progress Notes (Addendum)
Patient did not have preoperative order for IV antibiotic SSI prophylaxis. (G8918)  Patient did not experience any of the following events: a burn prior to discharge; a fall within the facility; wrong site/side/patient/procedure/implant event; or a hospital transfer or hospital admission upon discharge from the facility. (G8907)  

## 2012-09-02 ENCOUNTER — Telehealth: Payer: Self-pay | Admitting: *Deleted

## 2012-09-02 NOTE — Telephone Encounter (Signed)
  Follow up Call-  Call back number 09/01/2012  Post procedure Call Back phone  # [phone]  Permission to leave phone message Yes     Patient questions:  Do you have a fever, pain , or abdominal swelling? no Pain Score  0 *  Have you tolerated food without any problems? yes  Have you been able to return to your normal activities? yes  Do you have any questions about your discharge instructions: Diet   no Medications  no Follow up visit  no  Do you have questions or concerns about your Care? no  Actions: * If pain score is 4 or above: No action needed, pain <4.

## 2012-09-04 ENCOUNTER — Encounter: Payer: Self-pay | Admitting: Internal Medicine

## 2013-06-01 ENCOUNTER — Encounter: Payer: Self-pay | Admitting: Family Medicine

## 2013-06-01 ENCOUNTER — Ambulatory Visit (INDEPENDENT_AMBULATORY_CARE_PROVIDER_SITE_OTHER): Payer: 59 | Admitting: Family Medicine

## 2013-06-01 VITALS — BP 138/78 | HR 94 | Temp 102.0°F | Wt 233.8 lb

## 2013-06-01 DIAGNOSIS — R509 Fever, unspecified: Secondary | ICD-10-CM | POA: Insufficient documentation

## 2013-06-01 DIAGNOSIS — J020 Streptococcal pharyngitis: Secondary | ICD-10-CM

## 2013-06-01 MED ORDER — AZITHROMYCIN 250 MG PO TABS: ORAL_TABLET | ORAL | Status: DC

## 2013-06-01 NOTE — Patient Instructions (Addendum)
Start the antibiotics today.  Drink plenty of fluids, take tylenol as needed, and gargle with warm salt water for your throat if needed.  This should gradually improve.  Take care.  Let us know if you have other concerns.

## 2013-06-01 NOTE — Progress Notes (Signed)
ST, cough, rhinorrhea, fever, chest tightness, stuffy nose, chills.  Going on for about 2-3 days.  Some help with dayquil.  Green sputum.    Meds, vitals, and allergies reviewed.   ROS: See HPI.  Otherwise, noncontributory.  GEN: nad, alert and oriented HEENT: mucous membranes moist, tm w/o erythema, nasal exam w/o erythema, clear discharge noted,  OP with cobblestoning NECK: supple w/o LA CV: rrr.   PULM: ctab except for faint crackles on R mid lung fields, no inc wob EXT: no edema SKIN: no acute rash  RST neg

## 2013-06-01 NOTE — Assessment & Plan Note (Signed)
Faint crackles on R mid lung field.  Would treat with zmax given the fever.  Is well appearing in spite of fever.  D/w pt. Nontoxic. He agrees with plan, report back if not improving.  supporitve tx o/w.

## 2013-07-19 ENCOUNTER — Other Ambulatory Visit: Payer: Self-pay | Admitting: Family Medicine

## 2013-07-20 ENCOUNTER — Other Ambulatory Visit: Payer: Self-pay | Admitting: Family Medicine

## 2013-07-20 DIAGNOSIS — Z79899 Other long term (current) drug therapy: Secondary | ICD-10-CM

## 2013-07-20 DIAGNOSIS — Z13220 Encounter for screening for lipoid disorders: Secondary | ICD-10-CM

## 2013-07-20 DIAGNOSIS — Z125 Encounter for screening for malignant neoplasm of prostate: Secondary | ICD-10-CM

## 2013-07-24 ENCOUNTER — Other Ambulatory Visit (INDEPENDENT_AMBULATORY_CARE_PROVIDER_SITE_OTHER): Payer: 59

## 2013-07-24 DIAGNOSIS — Z125 Encounter for screening for malignant neoplasm of prostate: Secondary | ICD-10-CM

## 2013-07-24 DIAGNOSIS — Z79899 Other long term (current) drug therapy: Secondary | ICD-10-CM

## 2013-07-24 DIAGNOSIS — Z13220 Encounter for screening for lipoid disorders: Secondary | ICD-10-CM

## 2013-07-24 LAB — CBC WITH DIFFERENTIAL/PLATELET
Basophils Relative: 0.5 % (ref 0.0–3.0)
Eosinophils Absolute: 0.2 10*3/uL (ref 0.0–0.7)
Lymphocytes Relative: 36.9 % (ref 12.0–46.0)
MCHC: 34.1 g/dL (ref 30.0–36.0)
MCV: 88 fl (ref 78.0–100.0)
Monocytes Absolute: 0.5 10*3/uL (ref 0.1–1.0)
Monocytes Relative: 8.8 % (ref 3.0–12.0)
Neutro Abs: 3 10*3/uL (ref 1.4–7.7)
Neutrophils Relative %: 50.9 % (ref 43.0–77.0)
Platelets: 215 10*3/uL (ref 150.0–400.0)
RBC: 5.11 Mil/uL (ref 4.22–5.81)
WBC: 5.9 10*3/uL (ref 4.5–10.5)

## 2013-07-24 LAB — BASIC METABOLIC PANEL
BUN: 13 mg/dL (ref 6–23)
Calcium: 9.4 mg/dL (ref 8.4–10.5)
Chloride: 106 mEq/L (ref 96–112)
Creatinine, Ser: 0.9 mg/dL (ref 0.4–1.5)

## 2013-07-24 LAB — LDL CHOLESTEROL, DIRECT: Direct LDL: 85.1 mg/dL

## 2013-07-24 LAB — HEPATIC FUNCTION PANEL
AST: 22 U/L (ref 0–37)
Alkaline Phosphatase: 48 U/L (ref 39–117)
Bilirubin, Direct: 0.1 mg/dL (ref 0.0–0.3)
Total Protein: 6.5 g/dL (ref 6.0–8.3)

## 2013-07-24 LAB — LIPID PANEL
Cholesterol: 172 mg/dL (ref 0–200)
VLDL: 45.4 mg/dL — ABNORMAL HIGH (ref 0.0–40.0)

## 2013-07-24 LAB — PSA: PSA: 2.46 ng/mL (ref 0.10–4.00)

## 2013-07-29 ENCOUNTER — Encounter: Payer: Self-pay | Admitting: Family Medicine

## 2013-07-29 ENCOUNTER — Ambulatory Visit (INDEPENDENT_AMBULATORY_CARE_PROVIDER_SITE_OTHER): Payer: 59 | Admitting: Family Medicine

## 2013-07-29 VITALS — BP 120/84 | HR 75 | Temp 98.4°F | Ht 69.0 in | Wt 227.0 lb

## 2013-07-29 DIAGNOSIS — Z0000 Encounter for general adult medical examination without abnormal findings: Secondary | ICD-10-CM

## 2013-07-29 NOTE — Progress Notes (Signed)
[facility] [address] [place] [address] Phone: [phone] Fax: [phone]  Date:  07/29/2013   Name:  Jason Coffey   DOB:  [birth year]   MRN:  [id number] Gender: male Age: 52 y.o.  Primary Physician:  Hannah Beat, MD  Evaluating MD: Hannah Beat, MD   Chief Complaint: Annual Exam   History of Present Illness:  Jason Coffey is a 52 y.o. pleasant patient who presents with the following:  Preventative Health Maintenance Visit:  Health Maintenance Summary Reviewed and updated, unless pt declines services.  Tobacco History Reviewed. Alcohol: 3-4 a night on avg Exercise Habits: playing tennis once a week STD concerns: no risk or activity to increase risk Drug Use: None Encouraged self-testicular check  Health Maintenance  Topic Date Due  . Influenza Vaccine  05/29/2013  . Tetanus/tdap  10/29/2014  . Colonoscopy  09/02/2015    Labs reviewed with the patient.  Results for orders placed in visit on 07/24/13  LIPID PANEL      Result Value Range   Cholesterol 172  0 - 200 mg/dL   Triglycerides 865.7 (*) 0.0 - 149.0 mg/dL   HDL 84.69  >62.95 mg/dL   VLDL 28.4 (*) 0.0 - 13.2 mg/dL   Total CHOL/HDL Ratio 3    HEPATIC FUNCTION PANEL      Result Value Range   Total Bilirubin 0.7  0.3 - 1.2 mg/dL   Bilirubin, Direct 0.1  0.0 - 0.3 mg/dL   Alkaline Phosphatase 48  39 - 117 U/L   AST 22  0 - 37 U/L   ALT 34  0 - 53 U/L   Total Protein 6.5  6.0 - 8.3 g/dL   Albumin 4.0  3.5 - 5.2 g/dL  BASIC METABOLIC PANEL      Result Value Range   Sodium 139  135 - 145 mEq/L   Potassium 4.7  3.5 - 5.1 mEq/L   Chloride 106  96 - 112 mEq/L   CO2 26  19 - 32 mEq/L   Glucose, Bld 86  70 - 99 mg/dL   BUN 13  6 - 23 mg/dL   Creatinine, Ser 0.9  0.4 - 1.5 mg/dL   Calcium 9.4  8.4 - 44.0 mg/dL   GFR 10.27  >25.36 mL/min  CBC WITH DIFFERENTIAL      Result Value Range   WBC 5.9  4.5 - 10.5 K/uL   RBC 5.11  4.22 - 5.81 Mil/uL   Hemoglobin  15.3  13.0 - 17.0 g/dL   HCT 64.4  03.4 - 74.2 %   MCV 88.0  78.0 - 100.0 fl   MCHC 34.1  30.0 - 36.0 g/dL   RDW 59.5  63.8 - 75.6 %   Platelets 215.0  150.0 - 400.0 K/uL   Neutrophils Relative % 50.9  43.0 - 77.0 %   Lymphocytes Relative 36.9  12.0 - 46.0 %   Monocytes Relative 8.8  3.0 - 12.0 %   Eosinophils Relative 2.9  0.0 - 5.0 %   Basophils Relative 0.5  0.0 - 3.0 %   Neutro Abs 3.0  1.4 - 7.7 K/uL   Lymphs Abs 2.2  0.7 - 4.0 K/uL   Monocytes Absolute 0.5  0.1 - 1.0 K/uL   Eosinophils Absolute 0.2  0.0 - 0.7 K/uL   Basophils Absolute 0.0  0.0 - 0.1 K/uL  PSA      Result Value Range   PSA 2.46  0.10 - 4.00 ng/mL  LDL CHOLESTEROL, DIRECT      Result Value Range   Direct LDL 85.1       Patient Active Problem List   Diagnosis Date Noted  . Erectile dysfunction 04/27/2011  . OTHER MALAISE AND FATIGUE 01/09/2010  . ALLERGIC RHINITIS 12/08/2008    Past Medical History  Diagnosis Date  . Obesity   . Allergic rhinitis     hay fever  . Lactose intolerance     Past Surgical History  Procedure Laterality Date  . Inguinal hernia repair  1962  . Lumbar disc surgery  2006    L4-5  . Mtp surgery  1998    1st, right  . Tooth extraction      as child    History   Social History  . Marital Status: Married    Spouse Name: N/A    Number of Children: 1  . Years of Education: N/A   Occupational History  . Advertising copywriter [employer]    Social History Main Topics  . Smoking status: Former Smoker    Quit date: 10/30/1987  . Smokeless tobacco: Never Used     Comment: 15 pack year  . Alcohol Use: 9.6 oz/week    16 Cans of beer per week     Comment: 12/18 beers a week  . Drug Use: No  . Sexual Activity: Not on file   Other Topics Concern  . Not on file   Social History Narrative   Regular exercise- yes- golf, rare tennis    Family History  Problem Relation Age of Onset  . Stroke Paternal Grandfather     Allergies  Allergen Reactions  . Tape Rash  and Other (See Comments)    Blisters and bleeding    Medication list has been reviewed and updated.  Outpatient Prescriptions Prior to Visit  Medication Sig Dispense Refill  . ibuprofen (ADVIL,MOTRIN) 200 MG tablet Take 400 mg by mouth every 6 (six) hours as needed.        . Multiple Vitamin (MULTIVITAMIN) tablet Take 1 tablet by mouth daily.        . Omega-3 Fatty Acids (FISH OIL) 1000 MG CAPS Take by mouth daily.        . vitamin C (ASCORBIC ACID) 500 MG tablet Take 500 mg by mouth daily as needed.        No facility-administered medications prior to visit.    Review of Systems:   General: Denies fever, chills, sweats. No significant weight loss. Eyes: Denies blurring,significant itching ENT: Denies earache, sore throat, and hoarseness. Cardiovascular: Denies chest pains, palpitations, dyspnea on exertion Respiratory: Denies cough, dyspnea at rest,wheeezing Breast: no concerns about lumps GI: Denies nausea, vomiting, diarrhea, constipation, change in bowel habits, abdominal pain, melena, hematochezia GU: Denies penile discharge, ED, urinary flow / outflow problems. No STD concerns. Musculoskeletal: Denies back pain, joint pain Derm: Denies rash, itching Neuro: Denies  paresthesias, frequent falls, frequent headaches Psych: Denies depression, anxiety Endocrine: Denies cold intolerance, heat intolerance, polydipsia Heme: Denies enlarged lymph nodes Allergy: No hayfever  Physical Examination: BP 120/84  Pulse 75  Temp(Src) 98.4 F (36.9 C) (Oral)  Ht 5\' 9"  (1.753 m)  Wt 227 lb (102.967 kg)  BMI 33.51 kg/m2  SpO2 96%  Ideal Body Weight: Weight in (lb) to have BMI = 25: 168.9   Wt Readings from Last 3 Encounters:  07/29/13 227 lb (102.967 kg)  06/01/13 233 lb 12 oz (106.028 kg)  09/01/12 225 lb (102.059 kg)  GEN: well developed, well nourished, no acute distress Eyes: conjunctiva and lids normal, PERRLA, EOMI ENT: TM clear, nares clear, oral exam WNL Neck:  supple, no lymphadenopathy, no thyromegaly, no JVD Pulm: clear to auscultation and percussion, respiratory effort normal CV: regular rate and rhythm, S1-S2, no murmur, rub or gallop, no bruits, peripheral pulses normal and symmetric, no cyanosis, clubbing, edema or varicosities Chest: no scars, masses GI: soft, non-tender; no hepatosplenomegaly, masses; active bowel sounds all quadrants GU: no hernia, testicular mass, penile discharge, or prostate enlargement Lymph: no cervical, axillary or inguinal adenopathy MSK: gait normal, muscle tone and strength WNL, no joint swelling, effusions, discoloration, crepitus  SKIN: clear, good turgor, color WNL, no rashes, lesions, or ulcerations Neuro: normal mental status, normal strength, sensation, and motion Psych: alert; oriented to person, place and time, normally interactive and not anxious or depressed in appearance.  Assessment and Plan:  Routine general medical examination at a health care facility  The patient's preventative maintenance and recommended screening tests for an annual wellness exam were reviewed in full today. Brought up to date unless services declined.  Counselled on the importance of diet, exercise, and its role in overall health and mortality. The patient's FH and SH was reviewed, including their home life, tobacco status, and drug and alcohol status.   Work on alcohol and exercise.  Orders Today:  No orders of the defined types were placed in this encounter.    Updated Medication List: (Includes new medications, updates to list, dose adjustments) No orders of the defined types were placed in this encounter.    Medications Discontinued: There are no discontinued medications.    Signed, Elpidio Galea. Haillee Johann, MD 07/29/2013 2:45 PM

## 2014-07-23 ENCOUNTER — Other Ambulatory Visit: Payer: Self-pay | Admitting: Family Medicine

## 2014-07-23 DIAGNOSIS — Z79899 Other long term (current) drug therapy: Secondary | ICD-10-CM

## 2014-07-23 DIAGNOSIS — Z125 Encounter for screening for malignant neoplasm of prostate: Secondary | ICD-10-CM

## 2014-07-23 DIAGNOSIS — Z13220 Encounter for screening for lipoid disorders: Secondary | ICD-10-CM

## 2014-07-26 ENCOUNTER — Other Ambulatory Visit: Payer: Self-pay | Admitting: Family Medicine

## 2014-07-26 DIAGNOSIS — Z125 Encounter for screening for malignant neoplasm of prostate: Secondary | ICD-10-CM

## 2014-07-26 DIAGNOSIS — Z79899 Other long term (current) drug therapy: Secondary | ICD-10-CM

## 2014-07-26 DIAGNOSIS — Z13220 Encounter for screening for lipoid disorders: Secondary | ICD-10-CM

## 2014-07-30 ENCOUNTER — Other Ambulatory Visit (INDEPENDENT_AMBULATORY_CARE_PROVIDER_SITE_OTHER): Payer: 59

## 2014-07-30 DIAGNOSIS — Z13220 Encounter for screening for lipoid disorders: Secondary | ICD-10-CM

## 2014-07-30 DIAGNOSIS — Z79899 Other long term (current) drug therapy: Secondary | ICD-10-CM

## 2014-07-30 DIAGNOSIS — R5381 Other malaise: Secondary | ICD-10-CM

## 2014-07-30 DIAGNOSIS — R5383 Other fatigue: Secondary | ICD-10-CM

## 2014-07-30 DIAGNOSIS — Z125 Encounter for screening for malignant neoplasm of prostate: Secondary | ICD-10-CM

## 2014-07-30 LAB — HEPATIC FUNCTION PANEL
ALT: 33 U/L (ref 0–53)
AST: 24 U/L (ref 0–37)
Albumin: 4.1 g/dL (ref 3.5–5.2)
Alkaline Phosphatase: 57 U/L (ref 39–117)
BILIRUBIN DIRECT: 0.1 mg/dL (ref 0.0–0.3)
BILIRUBIN TOTAL: 0.9 mg/dL (ref 0.2–1.2)
Total Protein: 6.9 g/dL (ref 6.0–8.3)

## 2014-07-30 LAB — CBC WITH DIFFERENTIAL/PLATELET
BASOS PCT: 0.6 % (ref 0.0–3.0)
Basophils Absolute: 0 10*3/uL (ref 0.0–0.1)
EOS ABS: 0.2 10*3/uL (ref 0.0–0.7)
Eosinophils Relative: 3.3 % (ref 0.0–5.0)
HEMATOCRIT: 44.8 % (ref 39.0–52.0)
Hemoglobin: 14.9 g/dL (ref 13.0–17.0)
LYMPHS ABS: 1.9 10*3/uL (ref 0.7–4.0)
Lymphocytes Relative: 30.2 % (ref 12.0–46.0)
MCHC: 33.3 g/dL (ref 30.0–36.0)
MCV: 90.4 fl (ref 78.0–100.0)
MONO ABS: 0.5 10*3/uL (ref 0.1–1.0)
Monocytes Relative: 8.2 % (ref 3.0–12.0)
Neutro Abs: 3.6 10*3/uL (ref 1.4–7.7)
Neutrophils Relative %: 57.7 % (ref 43.0–77.0)
Platelets: 198 10*3/uL (ref 150.0–400.0)
RBC: 4.96 Mil/uL (ref 4.22–5.81)
RDW: 12.9 % (ref 11.5–15.5)
WBC: 6.2 10*3/uL (ref 4.0–10.5)

## 2014-07-30 LAB — BASIC METABOLIC PANEL
BUN: 12 mg/dL (ref 6–23)
CALCIUM: 9.5 mg/dL (ref 8.4–10.5)
CO2: 26 mEq/L (ref 19–32)
CREATININE: 0.8 mg/dL (ref 0.4–1.5)
Chloride: 104 mEq/L (ref 96–112)
GFR: 105.98 mL/min (ref >60.00)
GLUCOSE: 91 mg/dL (ref 70–99)
Potassium: 4.6 mEq/L (ref 3.5–5.1)
Sodium: 137 mEq/L (ref 135–145)

## 2014-07-30 LAB — LIPID PANEL
CHOLESTEROL: 184 mg/dL (ref 0–200)
HDL: 56.7 mg/dL (ref >39.00)
LDL Cholesterol: 102 mg/dL — ABNORMAL HIGH (ref 0–99)
NONHDL: 127.3
Total CHOL/HDL Ratio: 3
Triglycerides: 125 mg/dL (ref 0.0–149.0)
VLDL: 25 mg/dL (ref 0.0–40.0)

## 2014-07-30 LAB — PSA: PSA: 2.67 ng/mL (ref 0.10–4.00)

## 2014-08-04 ENCOUNTER — Encounter: Payer: Self-pay | Admitting: Family Medicine

## 2014-08-04 ENCOUNTER — Ambulatory Visit (INDEPENDENT_AMBULATORY_CARE_PROVIDER_SITE_OTHER): Payer: 59 | Admitting: Family Medicine

## 2014-08-04 VITALS — BP 110/70 | HR 71 | Temp 98.6°F | Ht 69.5 in | Wt 218.5 lb

## 2014-08-04 DIAGNOSIS — Z0000 Encounter for general adult medical examination without abnormal findings: Secondary | ICD-10-CM

## 2014-08-04 NOTE — Progress Notes (Signed)
Pre visit review using our clinic review tool, if applicable. No additional management support is needed unless otherwise documented below in the visit note. 

## 2014-08-04 NOTE — Progress Notes (Signed)
Dr. Frederico Hamman T. Gaila Engebretsen, MD, [facility] [place], [address] Phone: [phone] Fax: [phone]  08/04/2014  Patient: Jason Coffey, MRN: [id number], DOB: [birth year], 53 y.o.  Primary Physician:  Owens Loffler, MD  Chief Complaint: Annual Exam  Subjective:   Jason Coffey is a 52 y.o. pleasant patient who presents with the following:  Preventative Health Maintenance Visit:  Health Maintenance Summary Reviewed and updated, unless pt declines services.  Tobacco History Reviewed. Alcohol: No concerns, no excessive use Exercise Habits: Some activity, rec at least 30 mins 5 times a week STD concerns: no risk or activity to increase risk Drug Use: None Encouraged self-testicular check  Doing well.   Playing tennis every Sunday.  Daughter is in [place].   Refuses the flu shot.   Health Maintenance  Topic Date Due  . Influenza Vaccine  08/05/2015 (Originally 05/29/2014)  . Tetanus/tdap  10/29/2014  . Colonoscopy  09/02/2015   Immunization History  Administered Date(s) Administered  . Td 10/29/2004   Patient Active Problem List   Diagnosis Date Noted  . Erectile dysfunction 04/27/2011  . OTHER MALAISE AND FATIGUE 01/09/2010  . ALLERGIC RHINITIS 12/08/2008   Past Medical History  Diagnosis Date  . Obesity   . Allergic rhinitis     hay fever  . Lactose intolerance    Past Surgical History  Procedure Laterality Date  . Inguinal hernia repair  1962  . Lumbar disc surgery  2006    L4-5  . Mtp surgery  1998    1st, right  . Tooth extraction      as child   History   Social History  . Marital Status: Married    Spouse Name: N/A    Number of Children: 1  . Years of Education: N/A   Occupational History  . Air cabin [employer]    Social History Main Topics  . Smoking status: Former Smoker    Quit date: 10/30/1987  . Smokeless tobacco: Never Used     Comment: 15 pack year  .  Alcohol Use: 9.6 oz/week    16 Cans of beer per week     Comment: 12/18 beers a week  . Drug Use: No  . Sexual Activity: Not on file   Other Topics Concern  . Not on file   Social History Narrative   Regular exercise- yes- golf, rare tennis   Family History  Problem Relation Age of Onset  . Stroke Paternal Grandfather    Allergies  Allergen Reactions  . Tape Rash and Other (See Comments)    Blisters and bleeding    Medication list has been reviewed and updated.   General: Denies fever, chills, sweats. No significant weight loss. Eyes: Denies blurring,significant itching ENT: Denies earache, sore throat, and hoarseness. Cardiovascular: Denies chest pains, palpitations, dyspnea on exertion Respiratory: Denies cough, dyspnea at rest,wheeezing Breast: no concerns about lumps GI: Denies nausea, vomiting, diarrhea, constipation, change in bowel habits, abdominal pain, melena, hematochezia GU: Denies penile discharge, ED, urinary flow / outflow problems. No STD concerns. Musculoskeletal: Denies back pain, joint pain Derm: Denies rash, itching Neuro: Denies  paresthesias, frequent falls, frequent headaches Psych: Denies depression, anxiety Endocrine: Denies cold intolerance, heat intolerance, polydipsia Heme: Denies enlarged lymph nodes Allergy: No hayfever  Objective:   BP 110/70  Pulse 71  Temp(Src) 98.6 F (37 C) (Oral)  Ht 5' 9.5" (1.765 m)  Wt 218 lb 8 oz (99.111 kg)  BMI 31.82 kg/m2 Ideal Body Weight: Weight in (lb) to have BMI = 25: 171.4  No exam data present  GEN: well developed, well nourished, no acute distress Eyes: conjunctiva and lids normal, PERRLA, EOMI ENT: TM clear, nares clear, oral exam WNL Neck: supple, no lymphadenopathy, no thyromegaly, no JVD Pulm: clear to auscultation and percussion, respiratory effort normal CV: regular rate and rhythm, S1-S2, no murmur, rub or gallop, no bruits, peripheral pulses normal and symmetric, no cyanosis,  clubbing, edema or varicosities GI: soft, non-tender; no hepatosplenomegaly, masses; active bowel sounds all quadrants GU: no hernia, testicular mass, penile discharge Lymph: no cervical, axillary or inguinal adenopathy MSK: gait normal, muscle tone and strength WNL, no joint swelling, effusions, discoloration, crepitus  SKIN: clear, good turgor, color WNL, no rashes, lesions, or ulcerations Neuro: normal mental status, normal strength, sensation, and motion Psych: alert; oriented to person, place and time, normally interactive and not anxious or depressed in appearance. All labs reviewed with patient.  Lipids:    Component Value Date/Time   CHOL 184 07/30/2014 0817   TRIG 125.0 07/30/2014 0817   HDL 56.70 07/30/2014 0817   LDLDIRECT 85.1 07/24/2013 0820   VLDL 25.0 07/30/2014 0817   CHOLHDL 3 07/30/2014 0817   CBC: CBC Latest Ref Rng 07/30/2014 07/24/2013 07/22/2012  WBC 4.0 - 10.5 K/uL 6.2 5.9 6.6  Hemoglobin 13.0 - 17.0 g/dL 14.9 15.3 14.0  Hematocrit 39.0 - 52.0 % 44.8 45.0 42.0  Platelets 150.0 - 400.0 K/uL 198.0 215.0 671.2    Basic Metabolic Panel:    Component Value Date/Time   NA 137 07/30/2014 0817   K 4.6 07/30/2014 0817   CL 104 07/30/2014 0817   CO2 26 07/30/2014 0817   BUN 12 07/30/2014 0817   CREATININE 0.8 07/30/2014 0817   GLUCOSE 91 07/30/2014 0817   CALCIUM 9.5 07/30/2014 0817   Hepatic Function Latest Ref Rng 07/30/2014 07/24/2013 07/22/2012  Total Protein 6.0 - 8.3 g/dL 6.9 6.5 6.3  Albumin 3.5 - 5.2 g/dL 4.1 4.0 3.9  AST 0 - 37 U/L 24 22 19   ALT 0 - 53 U/L 33 34 23  Alk Phosphatase 39 - 117 U/L 57 48 51  Total Bilirubin 0.2 - 1.2 mg/dL 0.9 0.7 1.0  Bilirubin, Direct 0.0 - 0.3 mg/dL 0.1 0.1 0.1    Lab Results  Component Value Date   TSH 1.79 01/09/2010   Lab Results  Component Value Date   PSA 2.67 07/30/2014   PSA 2.46 07/24/2013   PSA 2.31 07/22/2012    Assessment and Plan:   Health care maintenance  Health Maintenance Exam: The patient's preventative  maintenance and recommended screening tests for an annual wellness exam were reviewed in full today. Brought up to date unless services declined.  Counselled on the importance of diet, exercise, and its role in overall health and mortality. The patient's FH and SH was reviewed, including their home life, tobacco status, and drug and alcohol status.  Follow-up: No Follow-up on file. Unless noted, follow-up in 1 year for Health Maintenance Exam.  New Prescriptions   No medications on file   No orders of the defined types were placed in this encounter.    Signed,  Maud Deed. Shontae Rosiles, MD   Patient's Medications  New Prescriptions   No medications on file  Previous Medications   IBUPROFEN (ADVIL,MOTRIN) 200 MG TABLET    Take 400 mg by mouth every 6 (six) hours as needed.     MULTIPLE VITAMIN (MULTIVITAMIN) TABLET  Take 1 tablet by mouth daily.     OMEGA-3 FATTY ACIDS (FISH OIL) 1000 MG CAPS    Take by mouth daily.    Modified Medications   No medications on file  Discontinued Medications   VITAMIN C (ASCORBIC ACID) 500 MG TABLET    Take 500 mg by mouth daily as needed.

## 2015-09-06 ENCOUNTER — Encounter: Payer: Self-pay | Admitting: Internal Medicine

## 2015-10-18 ENCOUNTER — Other Ambulatory Visit: Payer: Self-pay | Admitting: Family Medicine

## 2015-10-18 DIAGNOSIS — Z13220 Encounter for screening for lipoid disorders: Secondary | ICD-10-CM

## 2015-10-18 DIAGNOSIS — Z79899 Other long term (current) drug therapy: Secondary | ICD-10-CM

## 2015-10-18 DIAGNOSIS — Z125 Encounter for screening for malignant neoplasm of prostate: Secondary | ICD-10-CM

## 2015-10-26 ENCOUNTER — Other Ambulatory Visit (INDEPENDENT_AMBULATORY_CARE_PROVIDER_SITE_OTHER): Payer: Managed Care, Other (non HMO)

## 2015-10-26 DIAGNOSIS — Z125 Encounter for screening for malignant neoplasm of prostate: Secondary | ICD-10-CM

## 2015-10-26 DIAGNOSIS — Z13220 Encounter for screening for lipoid disorders: Secondary | ICD-10-CM

## 2015-10-26 DIAGNOSIS — Z79899 Other long term (current) drug therapy: Secondary | ICD-10-CM

## 2015-10-26 LAB — CBC WITH DIFFERENTIAL/PLATELET
BASOS ABS: 0 10*3/uL (ref 0.0–0.1)
Basophils Relative: 0.6 % (ref 0.0–3.0)
Eosinophils Absolute: 0.2 10*3/uL (ref 0.0–0.7)
Eosinophils Relative: 3.4 % (ref 0.0–5.0)
HEMATOCRIT: 45.1 % (ref 39.0–52.0)
HEMOGLOBIN: 15.2 g/dL (ref 13.0–17.0)
LYMPHS ABS: 1.5 10*3/uL (ref 0.7–4.0)
LYMPHS PCT: 26.7 % (ref 12.0–46.0)
MCHC: 33.6 g/dL (ref 30.0–36.0)
MCV: 88.7 fl (ref 78.0–100.0)
MONOS PCT: 7.4 % (ref 3.0–12.0)
Monocytes Absolute: 0.4 10*3/uL (ref 0.1–1.0)
NEUTROS PCT: 61.9 % (ref 43.0–77.0)
Neutro Abs: 3.5 10*3/uL (ref 1.4–7.7)
Platelets: 210 10*3/uL (ref 150.0–400.0)
RBC: 5.09 Mil/uL (ref 4.22–5.81)
RDW: 12.5 % (ref 11.5–15.5)
WBC: 5.6 10*3/uL (ref 4.0–10.5)

## 2015-10-26 LAB — LIPID PANEL
CHOLESTEROL: 187 mg/dL (ref 0–200)
HDL: 59.5 mg/dL (ref >39.00)
LDL Cholesterol: 96 mg/dL (ref 0–99)
NONHDL: 127.62
TRIGLYCERIDES: 160 mg/dL — AB (ref 0.0–149.0)
Total CHOL/HDL Ratio: 3
VLDL: 32 mg/dL (ref 0.0–40.0)

## 2015-10-26 LAB — BASIC METABOLIC PANEL
BUN: 12 mg/dL (ref 6–23)
CHLORIDE: 104 meq/L (ref 96–112)
CO2: 26 meq/L (ref 19–32)
CREATININE: 0.96 mg/dL (ref 0.40–1.50)
Calcium: 9.8 mg/dL (ref 8.4–10.5)
GFR: 86.7 mL/min (ref >60.00)
GLUCOSE: 91 mg/dL (ref 70–99)
Potassium: 4.6 mEq/L (ref 3.5–5.1)
Sodium: 139 mEq/L (ref 135–145)

## 2015-10-26 LAB — HEPATIC FUNCTION PANEL
ALBUMIN: 4.3 g/dL (ref 3.5–5.2)
ALT: 34 U/L (ref 0–53)
AST: 21 U/L (ref 0–37)
Alkaline Phosphatase: 54 U/L (ref 39–117)
BILIRUBIN DIRECT: 0.1 mg/dL (ref 0.0–0.3)
Total Bilirubin: 0.5 mg/dL (ref 0.2–1.2)
Total Protein: 6.8 g/dL (ref 6.0–8.3)

## 2015-10-26 LAB — PSA: PSA: 3.4 ng/mL (ref 0.10–4.00)

## 2015-11-02 ENCOUNTER — Ambulatory Visit (INDEPENDENT_AMBULATORY_CARE_PROVIDER_SITE_OTHER): Payer: Managed Care, Other (non HMO) | Admitting: Family Medicine

## 2015-11-02 ENCOUNTER — Encounter: Payer: Self-pay | Admitting: Family Medicine

## 2015-11-02 VITALS — BP 124/82 | HR 94 | Temp 98.9°F | Ht 69.5 in | Wt 226.8 lb

## 2015-11-02 DIAGNOSIS — Z0000 Encounter for general adult medical examination without abnormal findings: Secondary | ICD-10-CM

## 2015-11-02 DIAGNOSIS — Z23 Encounter for immunization: Secondary | ICD-10-CM

## 2015-11-02 NOTE — Progress Notes (Signed)
Dr. Frederico Hamman T. Machelle Raybon, MD, [facility] [place], [address] Phone: [phone] Fax: [phone]  11/02/2015  Patient: Jason Coffey, MRN: [id number], DOB: 15-Nov-1960, 55 y.o.  Primary Physician:  Owens Loffler, MD   Jason Complaint  Patient presents with  . Annual Exam   Subjective:   Jason Coffey is a 55 y.o. pleasant patient who presents with the following:  Preventative Health Maintenance Visit:  Health Maintenance Summary Reviewed and updated, unless pt declines services.  Nice relaxing couple of weeks.  Traveling more Less active - less tennis.   Tobacco History Reviewed. Alcohol: No concerns, no excessive use Exercise Habits: less tennis compared to prior years, rec at least 30 mins 5 times a week STD concerns: no risk or activity to increase risk Drug Use: None Encouraged self-testicular check  Health Maintenance  Topic Date Due  . Hepatitis C Screening  [birth year]  . HIV Screening  08/25/1976  . COLONOSCOPY  09/02/2015  . INFLUENZA VACCINE  01/27/2016 (Originally 05/30/2015)  . TETANUS/TDAP  11/01/2025   Immunization History  Administered Date(s) Administered  . Td 10/29/2004  . Tdap 11/02/2015   Patient Active Problem List   Diagnosis Date Noted  . Erectile dysfunction 04/27/2011  . OTHER MALAISE AND FATIGUE 01/09/2010  . ALLERGIC RHINITIS 12/08/2008   Past Medical History  Diagnosis Date  . Obesity   . Allergic rhinitis     hay fever  . Lactose intolerance    Past Surgical History  Procedure Laterality Date  . Inguinal hernia repair  1962  . Lumbar disc surgery  2006    L4-5  . Mtp surgery  1998    1st, right  . Tooth extraction      as child   Social History   Social History  . Marital Status: Married    Spouse Name: N/A  . Number of Children: 1  . Years of Education: N/A   Occupational History  . [employer] cabin [employer]    Social History Main Topics  .  Smoking status: Former Smoker    Quit date: 10/30/1987  . Smokeless tobacco: Never Used     Comment: 15 pack year  . Alcohol Use: 9.6 oz/week    16 Cans of beer per week     Comment: 12/18 beers a week  . Drug Use: No  . Sexual Activity: Not on file   Other Topics Concern  . Not on file   Social History Narrative   Regular exercise- yes- golf, rare tennis   Family History  Problem Relation Age of Onset  . Stroke Paternal Grandfather    Allergies  Allergen Reactions  . Tape Rash and Other (See Comments)    Blisters and bleeding    Medication list has been reviewed and updated.   General: Denies fever, chills, sweats. No significant weight loss. Eyes: Denies blurring,significant itching ENT: Denies earache, sore throat, and hoarseness. Cardiovascular: Denies chest pains, palpitations, dyspnea on exertion Respiratory: Denies cough, dyspnea at rest,wheeezing Breast: no concerns about lumps GI: Denies nausea, vomiting, diarrhea, constipation, change in bowel habits, abdominal pain, melena, hematochezia GU: Denies penile discharge, ED, urinary flow / outflow problems. No STD concerns. Musculoskeletal: Denies back pain, joint pain Derm: Denies rash, itching Neuro: Denies  paresthesias, frequent falls, frequent headaches Psych: Denies depression, anxiety Endocrine: Denies cold intolerance, heat intolerance, polydipsia Heme: Denies enlarged lymph nodes Allergy: No hayfever  Objective:   BP 124/82 mmHg  Pulse 94  Temp(Src) 98.9 F (37.2 C) (Oral)  Ht 5' 9.5" (1.765 m)  Wt 226 lb 12 oz (102.853 kg)  BMI 33.02 kg/m2 Ideal Body Weight: Weight in (lb) to have BMI = 25: 171.4  No exam data present  GEN: well developed, well nourished, no acute distress Eyes: conjunctiva and lids normal, PERRLA, EOMI ENT: TM clear, nares clear, oral exam WNL Neck: supple, no lymphadenopathy, no thyromegaly, no JVD Pulm: clear to auscultation and percussion, respiratory effort normal CV:  regular rate and rhythm, S1-S2, no murmur, rub or gallop, no bruits, peripheral pulses normal and symmetric, no cyanosis, clubbing, edema or varicosities GI: soft, non-tender; no hepatosplenomegaly, masses; active bowel sounds all quadrants GU: no hernia, testicular mass, penile discharge Lymph: no cervical, axillary or inguinal adenopathy MSK: gait normal, muscle tone and strength WNL, no joint swelling, effusions, discoloration, crepitus  SKIN: clear, good turgor, color WNL, no rashes, lesions, or ulcerations Neuro: normal mental status, normal strength, sensation, and motion Psych: alert; oriented to person, place and time, normally interactive and not anxious or depressed in appearance. All labs reviewed with patient.  Lipids:    Component Value Date/Time   CHOL 187 10/26/2015 0911   TRIG 160.0* 10/26/2015 0911   HDL 59.50 10/26/2015 0911   LDLDIRECT 85.1 07/24/2013 0820   VLDL 32.0 10/26/2015 0911   CHOLHDL 3 10/26/2015 0911   CBC: CBC Latest Ref Rng 10/26/2015 07/30/2014 07/24/2013  WBC 4.0 - 10.5 K/uL 5.6 6.2 5.9  Hemoglobin 13.0 - 17.0 g/dL 15.2 14.9 15.3  Hematocrit 39.0 - 52.0 % 45.1 44.8 45.0  Platelets 150.0 - 400.0 K/uL 210.0 198.0 981.1    Basic Metabolic Panel:    Component Value Date/Time   NA 139 10/26/2015 0911   K 4.6 10/26/2015 0911   CL 104 10/26/2015 0911   CO2 26 10/26/2015 0911   BUN 12 10/26/2015 0911   CREATININE 0.96 10/26/2015 0911   GLUCOSE 91 10/26/2015 0911   CALCIUM 9.8 10/26/2015 0911   Hepatic Function Latest Ref Rng 10/26/2015 07/30/2014 07/24/2013  Total Protein 6.0 - 8.3 g/dL 6.8 6.9 6.5  Albumin 3.5 - 5.2 g/dL 4.3 4.1 4.0  AST 0 - 37 U/L 21 24 22   ALT 0 - 53 U/L 34 33 34  Alk Phosphatase 39 - 117 U/L 54 57 48  Total Bilirubin 0.2 - 1.2 mg/dL 0.5 0.9 0.7  Bilirubin, Direct 0.0 - 0.3 mg/dL 0.1 0.1 0.1    Lab Results  Component Value Date   TSH 1.79 01/09/2010   Lab Results  Component Value Date   PSA 3.40 10/26/2015   PSA 2.67  07/30/2014   PSA 2.46 07/24/2013    Assessment and Plan:   Health care maintenance  Need for prophylactic vaccination with combined diphtheria-tetanus-pertussis (DTP) vaccine - Plan: Tdap vaccine greater than or equal to 7yo IM  Health Maintenance Exam: The patient's preventative maintenance and recommended screening tests for an annual wellness exam were reviewed in full today. Brought up to date unless services declined.  Counselled on the importance of diet, exercise, and its role in overall health and mortality. The patient's FH and SH was reviewed, including their home life, tobacco status, and drug and alcohol status.  Follow-up: No Follow-up on file. Unless noted, follow-up in 1 year for Health Maintenance Exam.  Orders Placed This Encounter  Procedures  . Tdap vaccine greater than or equal to 7yo IM    Signed,  Raef Sprigg T. Karlye Ihrig, MD   Patient's Medications  New Prescriptions   No medications on file  Previous Medications   IBUPROFEN (ADVIL,MOTRIN) 200 MG TABLET    Take 400 mg by mouth every 6 (six) hours as needed.     MULTIPLE VITAMIN (MULTIVITAMIN) TABLET    Take 1 tablet by mouth daily.     OMEGA-3 FATTY ACIDS (FISH OIL) 1000 MG CAPS    Take by mouth daily.    Modified Medications   No medications on file  Discontinued Medications   No medications on file

## 2015-11-02 NOTE — Progress Notes (Signed)
Pre visit review using our clinic review tool, if applicable. No additional management support is needed unless otherwise documented below in the visit note. 

## 2015-12-09 ENCOUNTER — Encounter: Payer: Self-pay | Admitting: Internal Medicine

## 2016-01-20 ENCOUNTER — Ambulatory Visit (AMBULATORY_SURGERY_CENTER): Payer: Self-pay | Admitting: *Deleted

## 2016-01-20 VITALS — Ht 69.5 in | Wt 231.0 lb

## 2016-01-20 DIAGNOSIS — Z86010 Personal history of colonic polyps: Secondary | ICD-10-CM

## 2016-01-20 MED ORDER — NA SULFATE-K SULFATE-MG SULF 17.5-3.13-1.6 GM/177ML PO SOLN: 1.0000 | Freq: Once | ORAL | Status: DC

## 2016-01-20 NOTE — Progress Notes (Signed)
No egg or soy allergy. No anesthesia problems.  No home O2.  No diet meds.  

## 2016-02-02 ENCOUNTER — Encounter: Payer: Self-pay | Admitting: Internal Medicine

## 2016-02-02 ENCOUNTER — Ambulatory Visit (AMBULATORY_SURGERY_CENTER): Payer: Managed Care, Other (non HMO) | Admitting: Internal Medicine

## 2016-02-02 VITALS — BP 123/79 | HR 79 | Temp 98.6°F | Resp 13 | Ht 69.0 in | Wt 231.0 lb

## 2016-02-02 DIAGNOSIS — D125 Benign neoplasm of sigmoid colon: Secondary | ICD-10-CM | POA: Diagnosis not present

## 2016-02-02 DIAGNOSIS — Z86010 Personal history of colonic polyps: Secondary | ICD-10-CM | POA: Diagnosis present

## 2016-02-02 DIAGNOSIS — D124 Benign neoplasm of descending colon: Secondary | ICD-10-CM | POA: Diagnosis not present

## 2016-02-02 DIAGNOSIS — K635 Polyp of colon: Secondary | ICD-10-CM | POA: Diagnosis not present

## 2016-02-02 MED ORDER — SODIUM CHLORIDE 0.9 % IV SOLN: 500.0000 mL | INTRAVENOUS | Status: DC

## 2016-02-02 NOTE — Progress Notes (Signed)
Patient awakening,vss,report to rn 

## 2016-02-02 NOTE — Op Note (Signed)
[place] Patient Name: Jason Coffey Procedure Date: 02/02/2016 10:06 AM MRN: [id number] Endoscopist: Jerene Bears , MD Age: 55 Date of Birth: 02-23-1961 Gender: Male Procedure:                Colonoscopy Indications:              High risk colon cancer surveillance: Personal                            history of multiple (3 or more) adenomas, Last                            colonoscopy: November 2013 Medicines:                Monitored Anesthesia Care Procedure:                Pre-Anesthesia Assessment:                           - Prior to the procedure, a History and Physical                            was performed, and patient medications and                            allergies were reviewed. The patient's tolerance of                            previous anesthesia was also reviewed. The risks                            and benefits of the procedure and the sedation                            options and risks were discussed with the patient.                            All questions were answered, and informed consent                            was obtained. Prior Anticoagulants: The patient has                            taken no previous anticoagulant or antiplatelet                            agents. ASA Grade Assessment: II - A patient with                            mild systemic disease. After reviewing the risks                            and benefits, the patient was deemed in  satisfactory condition to undergo the procedure.                           After obtaining informed consent, the colonoscope                            was passed under direct vision. Throughout the                            procedure, the patient's blood pressure, pulse, and                            oxygen saturations were monitored continuously. The                            Model CF-HQ190L [id number]) scope was introduced   through the anus and advanced to the the cecum,                            identified by appendiceal orifice and ileocecal                            valve. The colonoscopy was performed without                            difficulty. The patient tolerated the procedure                            well. The quality of the bowel preparation was fair                            and good except the cecum was fair. The ileocecal                            valve, appendiceal orifice, and rectum were                            photographed. Scope In: 10:22:54 AM Scope Out: 10:41:04 AM Scope Withdrawal Time: 0 hours 16 minutes 24 seconds  Total Procedure Duration: 0 hours 18 minutes 10 seconds  Findings:                 The perianal exam findings include a skin tag.                           A moderate amount of adherent stool was found at                            the hepatic flexure, in the ascending colon and in                            the cecum, making visualization difficult. Lavage                            of  the area was performed using copious amounts,                            resulting in incomplete clearance with fair                            visualization.                           A few diverticula were found in the right colon.                           Two sessile polyps were found in the sigmoid colon                            and descending colon. The polyps were 4 to 5 mm in                            size. These polyps were removed with a cold snare.                            Resection and retrieval were complete.                           The retroflexed view of the distal rectum and anal                            verge was normal and showed no anal or rectal                            abnormalities. Complications:            No immediate complications. Estimated Blood Loss:     Estimated blood loss: none. Impression:               - Preparation of the colon was  fair.                           - Stool at the hepatic flexure, in the ascending                            colon and in the cecum.                           - Diverticulosis in the right colon.                           - Two 4 to 5 mm polyps in the sigmoid colon and in                            the descending colon, removed with a cold snare.                            Resected and retrieved.                           -  The distal rectum and anal verge are normal on                            retroflexion view with exception of 1 skin tag                            present. Recommendation:           - Patient has a contact number available for                            emergencies. The signs and symptoms of potential                            delayed complications were discussed with the                            patient. Return to normal activities tomorrow.                            Written discharge instructions were provided to the                            patient.                           - Resume previous diet.                           - Continue present medications.                           - Await pathology results.                           - Repeat colonoscopy for surveillance based on                            pathology results. Jerene Bears, MD 02/02/2016 10:54:32 AM This report has been signed electronically.

## 2016-02-02 NOTE — Patient Instructions (Signed)
Handouts given: Polyps, Diverticulosis, High Fiber.    YOU HAD AN ENDOSCOPIC PROCEDURE TODAY AT [facility]:   Refer to the procedure report that was given to you for any specific questions about what was found during the examination.  If the procedure report does not answer your questions, please call your gastroenterologist to clarify.  If you requested that your care partner not be given the details of your procedure findings, then the procedure report has been included in a sealed envelope for you to review at your convenience later.  YOU SHOULD EXPECT: Some feelings of bloating in the abdomen. Passage of more gas than usual.  Walking can help get rid of the air that was put into your GI tract during the procedure and reduce the bloating. If you had a lower endoscopy (such as a colonoscopy or flexible sigmoidoscopy) you may notice spotting of blood in your stool or on the toilet paper. If you underwent a bowel prep for your procedure, you may not have a normal bowel movement for a few days.  Please Note:  You might notice some irritation and congestion in your nose or some drainage.  This is from the oxygen used during your procedure.  There is no need for concern and it should clear up in a day or so.  SYMPTOMS TO REPORT IMMEDIATELY:   Following lower endoscopy (colonoscopy or flexible sigmoidoscopy):  Excessive amounts of blood in the stool  Significant tenderness or worsening of abdominal pains  Swelling of the abdomen that is new, acute  Fever of 100F or higher   For urgent or emergent issues, a gastroenterologist can be reached at any hour by calling [phone].   DIET: Your first meal following the procedure should be a small meal and then it is ok to progress to your normal diet. Heavy or fried foods are harder to digest and may make you feel nauseous or bloated.  Likewise, meals heavy in dairy and vegetables can increase bloating.  Drink plenty of fluids but  you should avoid alcoholic beverages for 24 hours.  ACTIVITY:  You should plan to take it easy for the rest of today and you should NOT DRIVE or use heavy machinery until tomorrow (because of the sedation medicines used during the test).    FOLLOW UP: Our staff will call the number listed on your records the next business day following your procedure to check on you and address any questions or concerns that you may have regarding the information given to you following your procedure. If we do not reach you, we will leave a message.  However, if you are feeling well and you are not experiencing any problems, there is no need to return our call.  We will assume that you have returned to your regular daily activities without incident.  If any biopsies were taken you will be contacted by phone or by letter within the next 1-3 weeks.  Please call us at [phone] if you have not heard about the biopsies in 3 weeks.    SIGNATURES/CONFIDENTIALITY: You and/or your care partner have signed paperwork which will be entered into your electronic medical record.  These signatures attest to the fact that that the information above on your After Visit Summary has been reviewed and is understood.  Full responsibility of the confidentiality of this discharge information lies with you and/or your care-partner.

## 2016-02-02 NOTE — Progress Notes (Signed)
Called to room to assist during endoscopic procedure.  Patient ID and intended procedure confirmed with present staff. Received instructions for my participation in the procedure from the performing physician.  

## 2016-02-03 ENCOUNTER — Telehealth: Payer: Self-pay

## 2016-02-03 NOTE — Telephone Encounter (Signed)
  Follow up Call-  Call back number 02/02/2016  Post procedure Call Back phone  # [phone]  Permission to leave phone message Yes    Patient was called for follow up after procedure on 02/02/2016. No answer at the number given for follow up phone call. A message was left on the answering machine.

## 2016-02-13 ENCOUNTER — Encounter: Payer: Self-pay | Admitting: Internal Medicine

## 2016-10-23 ENCOUNTER — Other Ambulatory Visit: Payer: Self-pay | Admitting: Family Medicine

## 2016-10-23 DIAGNOSIS — Z0000 Encounter for general adult medical examination without abnormal findings: Secondary | ICD-10-CM

## 2016-10-23 DIAGNOSIS — Z1159 Encounter for screening for other viral diseases: Secondary | ICD-10-CM

## 2016-10-23 DIAGNOSIS — Z125 Encounter for screening for malignant neoplasm of prostate: Secondary | ICD-10-CM

## 2016-10-23 DIAGNOSIS — Z13220 Encounter for screening for lipoid disorders: Secondary | ICD-10-CM

## 2016-10-23 DIAGNOSIS — Z114 Encounter for screening for human immunodeficiency virus [HIV]: Secondary | ICD-10-CM

## 2016-10-25 ENCOUNTER — Other Ambulatory Visit (INDEPENDENT_AMBULATORY_CARE_PROVIDER_SITE_OTHER): Payer: Managed Care, Other (non HMO)

## 2016-10-25 ENCOUNTER — Ambulatory Visit (INDEPENDENT_AMBULATORY_CARE_PROVIDER_SITE_OTHER): Payer: Managed Care, Other (non HMO)

## 2016-10-25 DIAGNOSIS — Z23 Encounter for immunization: Secondary | ICD-10-CM | POA: Diagnosis not present

## 2016-10-25 DIAGNOSIS — Z125 Encounter for screening for malignant neoplasm of prostate: Secondary | ICD-10-CM

## 2016-10-25 DIAGNOSIS — Z1159 Encounter for screening for other viral diseases: Secondary | ICD-10-CM

## 2016-10-25 DIAGNOSIS — Z13220 Encounter for screening for lipoid disorders: Secondary | ICD-10-CM

## 2016-10-25 DIAGNOSIS — R7989 Other specified abnormal findings of blood chemistry: Secondary | ICD-10-CM | POA: Diagnosis not present

## 2016-10-25 DIAGNOSIS — Z0000 Encounter for general adult medical examination without abnormal findings: Secondary | ICD-10-CM | POA: Diagnosis not present

## 2016-10-25 DIAGNOSIS — Z114 Encounter for screening for human immunodeficiency virus [HIV]: Secondary | ICD-10-CM

## 2016-10-25 LAB — BASIC METABOLIC PANEL
BUN: 12 mg/dL (ref 6–23)
CO2: 31 mEq/L (ref 19–32)
CREATININE: 0.91 mg/dL (ref 0.40–1.50)
Calcium: 9.6 mg/dL (ref 8.4–10.5)
Chloride: 102 mEq/L (ref 96–112)
GFR: 91.88 mL/min (ref >60.00)
Glucose, Bld: 91 mg/dL (ref 70–99)
Potassium: 5 mEq/L (ref 3.5–5.1)
Sodium: 140 mEq/L (ref 135–145)

## 2016-10-25 LAB — CBC WITH DIFFERENTIAL/PLATELET
BASOS PCT: 0.7 % (ref 0.0–3.0)
Basophils Absolute: 0.1 10*3/uL (ref 0.0–0.1)
EOS PCT: 3.5 % (ref 0.0–5.0)
Eosinophils Absolute: 0.3 10*3/uL (ref 0.0–0.7)
HEMATOCRIT: 43.7 % (ref 39.0–52.0)
Hemoglobin: 15 g/dL (ref 13.0–17.0)
LYMPHS PCT: 31.1 % (ref 12.0–46.0)
Lymphs Abs: 2.3 10*3/uL (ref 0.7–4.0)
MCHC: 34.3 g/dL (ref 30.0–36.0)
MCV: 88.5 fl (ref 78.0–100.0)
MONOS PCT: 9.4 % (ref 3.0–12.0)
Monocytes Absolute: 0.7 10*3/uL (ref 0.1–1.0)
NEUTROS ABS: 4 10*3/uL (ref 1.4–7.7)
Neutrophils Relative %: 55.3 % (ref 43.0–77.0)
PLATELETS: 222 10*3/uL (ref 150.0–400.0)
RBC: 4.94 Mil/uL (ref 4.22–5.81)
RDW: 12.7 % (ref 11.5–15.5)
WBC: 7.3 10*3/uL (ref 4.0–10.5)

## 2016-10-25 LAB — HEPATIC FUNCTION PANEL
ALT: 36 U/L (ref 0–53)
AST: 19 U/L (ref 0–37)
Albumin: 4.3 g/dL (ref 3.5–5.2)
Alkaline Phosphatase: 52 U/L (ref 39–117)
BILIRUBIN DIRECT: 0.1 mg/dL (ref 0.0–0.3)
BILIRUBIN TOTAL: 0.6 mg/dL (ref 0.2–1.2)
Total Protein: 6.5 g/dL (ref 6.0–8.3)

## 2016-10-25 LAB — LIPID PANEL
CHOL/HDL RATIO: 3
Cholesterol: 185 mg/dL (ref 0–200)
HDL: 53.7 mg/dL (ref >39.00)
NonHDL: 131.76
Triglycerides: 256 mg/dL — ABNORMAL HIGH (ref 0.0–149.0)
VLDL: 51.2 mg/dL — AB (ref 0.0–40.0)

## 2016-10-25 LAB — LDL CHOLESTEROL, DIRECT: Direct LDL: 96 mg/dL

## 2016-10-25 LAB — PSA: PSA: 4.6 ng/mL — AB (ref 0.10–4.00)

## 2016-10-26 LAB — HEPATITIS C ANTIBODY: HCV Ab: NEGATIVE

## 2016-10-26 LAB — HIV ANTIBODY (ROUTINE TESTING W REFLEX): HIV: NONREACTIVE

## 2016-10-31 ENCOUNTER — Other Ambulatory Visit: Payer: Managed Care, Other (non HMO)

## 2016-11-05 ENCOUNTER — Other Ambulatory Visit: Payer: Managed Care, Other (non HMO)

## 2016-11-12 ENCOUNTER — Ambulatory Visit (INDEPENDENT_AMBULATORY_CARE_PROVIDER_SITE_OTHER): Payer: Managed Care, Other (non HMO) | Admitting: Family Medicine

## 2016-11-12 ENCOUNTER — Encounter: Payer: Self-pay | Admitting: Family Medicine

## 2016-11-12 VITALS — BP 120/72 | HR 85 | Temp 98.9°F | Ht 69.5 in | Wt 234.5 lb

## 2016-11-12 DIAGNOSIS — Z1211 Encounter for screening for malignant neoplasm of colon: Secondary | ICD-10-CM

## 2016-11-12 DIAGNOSIS — R972 Elevated prostate specific antigen [PSA]: Secondary | ICD-10-CM

## 2016-11-12 DIAGNOSIS — Z0000 Encounter for general adult medical examination without abnormal findings: Secondary | ICD-10-CM | POA: Diagnosis not present

## 2016-11-12 NOTE — Patient Instructions (Signed)
Follow-up for PSA blood draw in 2-4 weeks   REFERRALS TO SPECIALISTS, SPECIAL TESTS (MRI, CT, ULTRASOUNDS)  [facility] or [facility] will help you. ASK CHECK-IN FOR HELP.  Imaging / Special Testing referrals sometimes can be done same day if EMERGENCY, but others can take 2 or 3 days to get an appointment. Starting in 2015, many of the new Medicare plans and Obamacare plans take much longer.   Specialist appointment times vary a great deal, based on their schedule / openings. -- Some specialists have very long wait times. (Example. Dermatology. Multiple months  for non-cancer)

## 2016-11-12 NOTE — Progress Notes (Signed)
Dr. Frederico Hamman T. Xzavien Harada, MD, [facility] [place], [address] Phone: [phone] Fax: [phone]  11/12/2016  Patient: Jason Coffey, MRN: [id number], DOB: Jan 17, 1961, 56 y.o.  Primary Physician:  Owens Loffler, MD   Chief Complaint  Patient presents with  . Annual Exam   Subjective:   Jason Coffey is a 56 y.o. pleasant patient who presents with the following:  Preventative Health Maintenance Visit:  Health Maintenance Summary Reviewed and updated, unless pt declines services.  Tobacco History Reviewed. Alcohol: No concerns, no excessive use Exercise Habits: Decreased activity, rec at least 30 mins 5 times a week STD concerns: no risk or activity to increase risk Drug Use: None Encouraged self-testicular check  Health Maintenance  Topic Date Due  . COLONOSCOPY  02/01/2017  . TETANUS/TDAP  11/01/2025  . INFLUENZA VACCINE  Completed  . Hepatitis C Screening  Completed  . HIV Screening  Completed   Immunization History  Administered Date(s) Administered  . Influenza,inj,Quad PF,36+ Mos 10/25/2016  . Td 10/29/2004  . Tdap 11/02/2015   Patient Active Problem List   Diagnosis Date Noted  . Erectile dysfunction 04/27/2011  . OTHER MALAISE AND FATIGUE 01/09/2010  . ALLERGIC RHINITIS 12/08/2008   Past Medical History:  Diagnosis Date  . Allergic rhinitis    hay fever  . Colon polyp   . Lactose intolerance   . Obesity   . Seasonal allergies    Past Surgical History:  Procedure Laterality Date  . [place]  . LUMBAR DISC SURGERY  2006   L4-5  . MTP surgery  1998   1st, right, toe surgery  . TOOTH EXTRACTION     as child   Social History   Social History  . Marital status: Married    Spouse name: N/A  . Number of children: 1  . Years of education: N/A   Occupational History  . [employer]    Social History Main Topics  . Smoking status: Former  Smoker    Quit date: 10/30/1987  . Smokeless tobacco: Never Used     Comment: 15 pack year  . Alcohol use 8.4 oz/week    14 Cans of beer per week     Comment: 14  . Drug use: No  . Sexual activity: Not on file   Other Topics Concern  . Not on file   Social History Narrative   Regular exercise- yes- golf, rare tennis   Family History  Problem Relation Age of Onset  . Stroke Paternal Grandfather   . Colon cancer Neg Hx    Allergies  Allergen Reactions  . Tape Rash and Other (See Comments)    Blisters and bleeding    Medication list has been reviewed and updated.   General: Denies fever, chills, sweats. No significant weight loss. Eyes: Denies blurring,significant itching ENT: Denies earache, sore throat, and hoarseness. Cardiovascular: Denies chest pains, palpitations, dyspnea on exertion Respiratory: Denies cough, dyspnea at rest,wheeezing Breast: no concerns about lumps GI: Denies nausea, vomiting, diarrhea, constipation, change in bowel habits, abdominal pain, melena, hematochezia GU: Denies penile discharge, ED, urinary flow / outflow problems. No STD concerns. Musculoskeletal: Denies back pain, joint pain Derm: Denies rash, itching Neuro: Denies  paresthesias, frequent falls, frequent headaches Psych: Denies depression, anxiety Endocrine: Denies cold intolerance, heat intolerance, polydipsia Heme: Denies enlarged lymph nodes Allergy: No hayfever  Objective:   BP 120/72   Pulse 85  Temp 98.9 F (37.2 C) (Oral)   Ht 5' 9.5" (1.765 m)   Wt 234 lb 8 oz (106.4 kg)   BMI 34.13 kg/m  Ideal Body Weight: Weight in (lb) to have BMI = 25: 171.4  No exam data present  GEN: well developed, well nourished, no acute distress Eyes: conjunctiva and lids normal, PERRLA, EOMI ENT: TM clear, nares clear, oral exam WNL Neck: supple, no lymphadenopathy, no thyromegaly, no JVD Pulm: clear to auscultation and percussion, respiratory effort normal CV: regular rate and  rhythm, S1-S2, no murmur, rub or gallop, no bruits, peripheral pulses normal and symmetric, no cyanosis, clubbing, edema or varicosities GI: soft, non-tender; no hepatosplenomegaly, masses; active bowel sounds all quadrants GU: no hernia, testicular mass, penile discharge Lymph: no cervical, axillary or inguinal adenopathy MSK: gait normal, muscle tone and strength WNL, no joint swelling, effusions, discoloration, crepitus  SKIN: clear, good turgor, color WNL, no rashes, lesions, or ulcerations Neuro: normal mental status, normal strength, sensation, and motion Psych: alert; oriented to person, place and time, normally interactive and not anxious or depressed in appearance. All labs reviewed with patient.  Lipids:    Component Value Date/Time   CHOL 185 10/25/2016 0815   TRIG 256.0 (H) 10/25/2016 0815   HDL 53.70 10/25/2016 0815   LDLDIRECT 96.0 10/25/2016 0815   VLDL 51.2 (H) 10/25/2016 0815   CHOLHDL 3 10/25/2016 0815   CBC: CBC Latest Ref Rng & Units 10/25/2016 10/26/2015 07/30/2014  WBC 4.0 - 10.5 K/uL 7.3 5.6 6.2  Hemoglobin 13.0 - 17.0 g/dL 15.0 15.2 14.9  Hematocrit 39.0 - 52.0 % 43.7 45.1 44.8  Platelets 150.0 - 400.0 K/uL 222.0 210.0 734.2    Basic Metabolic Panel:    Component Value Date/Time   NA 140 10/25/2016 0815   K 5.0 10/25/2016 0815   CL 102 10/25/2016 0815   CO2 31 10/25/2016 0815   BUN 12 10/25/2016 0815   CREATININE 0.91 10/25/2016 0815   GLUCOSE 91 10/25/2016 0815   CALCIUM 9.6 10/25/2016 0815   Hepatic Function Latest Ref Rng & Units 10/25/2016 10/26/2015 07/30/2014  Total Protein 6.0 - 8.3 g/dL 6.5 6.8 6.9  Albumin 3.5 - 5.2 g/dL 4.3 4.3 4.1  AST 0 - 37 U/L 19 21 24   ALT 0 - 53 U/L 36 34 33  Alk Phosphatase 39 - 117 U/L 52 54 57  Total Bilirubin 0.2 - 1.2 mg/dL 0.6 0.5 0.9  Bilirubin, Direct 0.0 - 0.3 mg/dL 0.1 0.1 0.1    Lab Results  Component Value Date   TSH 1.79 01/09/2010   Lab Results  Component Value Date   PSA 4.60 (H) 10/25/2016     PSA 3.40 10/26/2015   PSA 2.67 07/30/2014    Assessment and Plan:   Healthcare maintenance  Elevated PSA, less than 10 ng/ml - Plan: PSA, total and free  Special screening for malignant neoplasms, colon - Plan: Ambulatory referral to Gastroenterology  Repeat PSA in 1 mo  Health Maintenance Exam: The patient's preventative maintenance and recommended screening tests for an annual wellness exam were reviewed in full today. Brought up to date unless services declined.  Counselled on the importance of diet, exercise, and its role in overall health and mortality. The patient's FH and SH was reviewed, including their home life, tobacco status, and drug and alcohol status.  Follow-up in 1 year for physical exam or additional follow-up below.  Follow-up: No Follow-up on file. Or follow-up in 1 year if not noted.  No orders  of the defined types were placed in this encounter.  There are no discontinued medications. Orders Placed This Encounter  Procedures  . PSA, total and free  . Ambulatory referral to Gastroenterology    Signed,  Frederico Hamman T. Ebba Goll, MD   Allergies as of 11/12/2016      Reactions   Tape Rash, Other (See Comments)   Blisters and bleeding      Medication List       Accurate as of 11/12/16  9:07 AM. Always use your most recent med list.          Fish Oil 1000 MG Caps Take by mouth daily.   ibuprofen 200 MG tablet Commonly known as:  ADVIL,MOTRIN Take 400 mg by mouth every 6 (six) hours as needed.   multivitamin tablet Take 1 tablet by mouth daily.   VITAMIN C PO Take by mouth.

## 2016-11-12 NOTE — Addendum Note (Signed)
Addended by: Frutoso Chase A on: 11/12/2016 10:10 AM   Modules accepted: Orders

## 2016-11-12 NOTE — Progress Notes (Signed)
Pre visit review using our clinic review tool, if applicable. No additional management support is needed unless otherwise documented below in the visit note. 

## 2016-11-16 ENCOUNTER — Encounter: Payer: Self-pay | Admitting: *Deleted

## 2016-12-03 ENCOUNTER — Encounter: Payer: Self-pay | Admitting: Internal Medicine

## 2016-12-05 ENCOUNTER — Other Ambulatory Visit (INDEPENDENT_AMBULATORY_CARE_PROVIDER_SITE_OTHER): Payer: Managed Care, Other (non HMO)

## 2016-12-05 DIAGNOSIS — R972 Elevated prostate specific antigen [PSA]: Secondary | ICD-10-CM | POA: Diagnosis not present

## 2016-12-06 LAB — PSA, TOTAL AND FREE
PSA, % Free: 12 % — ABNORMAL LOW (ref >25)
PSA, FREE: 0.4 ng/mL
PSA, Total: 3.3 ng/mL (ref <=4.0)

## 2016-12-21 ENCOUNTER — Encounter: Payer: Self-pay | Admitting: Family Medicine

## 2017-01-10 ENCOUNTER — Encounter: Payer: Self-pay | Admitting: Internal Medicine

## 2017-01-23 ENCOUNTER — Encounter: Payer: Self-pay | Admitting: Family Medicine

## 2017-02-25 ENCOUNTER — Ambulatory Visit (AMBULATORY_SURGERY_CENTER): Payer: Self-pay

## 2017-02-25 VITALS — Ht 69.5 in | Wt 237.8 lb

## 2017-02-25 DIAGNOSIS — Z860100 Personal history of colon polyps, unspecified: Secondary | ICD-10-CM

## 2017-02-25 MED ORDER — PEG 3350-KCL-NA BICARB-NACL 420 G PO SOLR: 4000.0000 mL | Freq: Once | ORAL | 0 refills | Status: AC

## 2017-02-25 NOTE — Progress Notes (Signed)
No allergies to eggs or soy No home oxygen No past problems with anesthesia No diet meds  Registered for emmi

## 2017-03-11 ENCOUNTER — Ambulatory Visit (AMBULATORY_SURGERY_CENTER): Payer: 59 | Admitting: Internal Medicine

## 2017-03-11 ENCOUNTER — Encounter: Payer: Self-pay | Admitting: Internal Medicine

## 2017-03-11 VITALS — BP 113/70 | HR 65 | Temp 97.8°F | Resp 15 | Ht 69.5 in | Wt 234.0 lb

## 2017-03-11 DIAGNOSIS — D122 Benign neoplasm of ascending colon: Secondary | ICD-10-CM

## 2017-03-11 DIAGNOSIS — D123 Benign neoplasm of transverse colon: Secondary | ICD-10-CM | POA: Diagnosis not present

## 2017-03-11 DIAGNOSIS — Z860100 Personal history of colon polyps, unspecified: Secondary | ICD-10-CM

## 2017-03-11 DIAGNOSIS — K635 Polyp of colon: Secondary | ICD-10-CM

## 2017-03-11 MED ORDER — SODIUM CHLORIDE 0.9 % IV SOLN: 500.0000 mL | INTRAVENOUS | Status: DC

## 2017-03-11 NOTE — Progress Notes (Signed)
Report given to PACU, vss 

## 2017-03-11 NOTE — Patient Instructions (Signed)
Discharge instructions given. Handouts on polyps and diverticulosis. Resume previous medications. YOU HAD AN ENDOSCOPIC PROCEDURE TODAY AT THE [facility]:   Refer to the procedure report that was given to you for any specific questions about what was found during the examination.  If the procedure report does not answer your questions, please call your gastroenterologist to clarify.  If you requested that your care partner not be given the details of your procedure findings, then the procedure report has been included in a sealed envelope for you to review at your convenience later.  YOU SHOULD EXPECT: Some feelings of bloating in the abdomen. Passage of more gas than usual.  Walking can help get rid of the air that was put into your GI tract during the procedure and reduce the bloating. If you had a lower endoscopy (such as a colonoscopy or flexible sigmoidoscopy) you may notice spotting of blood in your stool or on the toilet paper. If you underwent a bowel prep for your procedure, you may not have a normal bowel movement for a few days.  Please Note:  You might notice some irritation and congestion in your nose or some drainage.  This is from the oxygen used during your procedure.  There is no need for concern and it should clear up in a day or so.  SYMPTOMS TO REPORT IMMEDIATELY:   Following lower endoscopy (colonoscopy or flexible sigmoidoscopy):  Excessive amounts of blood in the stool  Significant tenderness or worsening of abdominal pains  Swelling of the abdomen that is new, acute  Fever of 100F or higher   For urgent or emergent issues, a gastroenterologist can be reached at any hour by calling [phone].   DIET:  We do recommend a small meal at first, but then you may proceed to your regular diet.  Drink plenty of fluids but you should avoid alcoholic beverages for 24 hours.  ACTIVITY:  You should plan to take it easy for the rest of today and you should NOT  DRIVE or use heavy machinery until tomorrow (because of the sedation medicines used during the test).    FOLLOW UP: Our staff will call the number listed on your records the next business day following your procedure to check on you and address any questions or concerns that you may have regarding the information given to you following your procedure. If we do not reach you, we will leave a message.  However, if you are feeling well and you are not experiencing any problems, there is no need to return our call.  We will assume that you have returned to your regular daily activities without incident.  If any biopsies were taken you will be contacted by phone or by letter within the next 1-3 weeks.  Please call us at [phone] if you have not heard about the biopsies in 3 weeks.    SIGNATURES/CONFIDENTIALITY: You and/or your care partner have signed paperwork which will be entered into your electronic medical record.  These signatures attest to the fact that that the information above on your After Visit Summary has been reviewed and is understood.  Full responsibility of the confidentiality of this discharge information lies with you and/or your care-partner. 

## 2017-03-11 NOTE — Progress Notes (Signed)
Called to room to assist during endoscopic procedure.  Patient ID and intended procedure confirmed with present staff. Received instructions for my participation in the procedure from the performing physician.  

## 2017-03-11 NOTE — Op Note (Signed)
[facility] Patient Name: Jason Coffey Procedure Date: 03/11/2017 8:05 AM MRN: [id number] Endoscopist: Jerene Bears , MD Age: 56 Referring MD:  Date of Birth: May 17, 1961 Gender: Male Account #: [id number] Procedure:                Colonoscopy Indications:              High risk colon cancer surveillance: Personal                            history of colonic polyps (last colonoscopy 1 yr                            ago with inadequate prep in the right colon) Medicines:                Monitored Anesthesia Care Procedure:                Pre-Anesthesia Assessment:                           - Prior to the procedure, a History and Physical                            was performed, and patient medications and                            allergies were reviewed. The patient's tolerance of                            previous anesthesia was also reviewed. The risks                            and benefits of the procedure and the sedation                            options and risks were discussed with the patient.                            All questions were answered, and informed consent                            was obtained. Prior Anticoagulants: The patient has                            taken no previous anticoagulant or antiplatelet                            agents. ASA Grade Assessment: II - A patient with                            mild systemic disease. After reviewing the risks                            and benefits, the patient was deemed in  satisfactory condition to undergo the procedure.                           After obtaining informed consent, the colonoscope                            was passed under direct vision. Throughout the                            procedure, the patient's blood pressure, pulse, and                            oxygen saturations were monitored continuously. The                            Colonoscope was  introduced through the anus and                            advanced to the the cecum, identified by                            appendiceal orifice and ileocecal valve. The                            colonoscopy was performed without difficulty. The                            patient tolerated the procedure well. The quality                            of the bowel preparation was good (2 day). The                            ileocecal valve, appendiceal orifice, and rectum                            were photographed. Scope In: 8:13:53 AM Scope Out: 8:29:17 AM Scope Withdrawal Time: 0 hours 14 minutes 13 seconds  Total Procedure Duration: 0 hours 15 minutes 24 seconds  Findings:                 The digital rectal exam was normal.                           Two sessile polyps were found in the ascending                            colon. The polyps were 3 to 4 mm in size. These                            polyps were removed with a cold snare. Resection                            and retrieval were complete.  Two sessile polyps were found in the splenic                            flexure and transverse colon. The polyps were 3 to                            5 mm in size. These polyps were removed with a cold                            snare. Resection and retrieval were complete.                           Scattered diverticula were found in the descending                            colon, hepatic flexure and ascending colon.                           A scattered area of mildly erythematous mucosa was                            found in the descending colon, in the transverse                            colon and in the ascending colon.                           The retroflexed view of the distal rectum and anal                            verge was normal and showed no anal or rectal                            abnormalities. Complications:            No immediate  complications. Estimated Blood Loss:     Estimated blood loss was minimal. Impression:               - Two 3 to 4 mm polyps in the ascending colon,                            removed with a cold snare. Resected and retrieved.                           - Two 3 to 5 mm polyps at the splenic flexure and                            in the transverse colon, removed with a cold snare.                            Resected and retrieved.                           -  Mild diverticulosis in the descending colon, at                            the hepatic flexure and in the ascending colon.                           - Erythematous mucosa in the descending colon, in                            the transverse colon and in the ascending colon.                            Mild. Query NSAID induced.                           - The distal rectum and anal verge are normal on                            retroflexion view. Recommendation:           - Patient has a contact number available for                            emergencies. The signs and symptoms of potential                            delayed complications were discussed with the                            patient. Return to normal activities tomorrow.                            Written discharge instructions were provided to the                            patient.                           - Resume previous diet.                           - Continue present medications.                           - Await pathology results.                           - Repeat colonoscopy is recommended. The                            colonoscopy date will be determined after pathology                            results from today's exam become available for  review. Jerene Bears, MD 03/11/2017 8:34:41 AM This report has been signed electronically.

## 2017-03-12 ENCOUNTER — Telehealth: Payer: Self-pay | Admitting: *Deleted

## 2017-03-12 NOTE — Telephone Encounter (Signed)
No answer left message and will call back later this afternoon. SM

## 2017-03-12 NOTE — Telephone Encounter (Signed)
  Follow up Call-  Call back number 03/11/2017 02/02/2016  Post procedure Call Back phone  # [phone]  Permission to leave phone message Yes Yes  Some recent data might be hidden     Patient questions:  Do you have a fever, pain , or abdominal swelling? No. Pain Score  0 *  Have you tolerated food without any problems? Yes.    Have you been able to return to your normal activities? Yes.    Do you have any questions about your discharge instructions: Diet   No. Medications  No. Follow up visit  No.  Do you have questions or concerns about your Care? No.  Actions: * If pain score is 4 or above: No action needed, pain <4.

## 2017-03-18 ENCOUNTER — Encounter: Payer: Self-pay | Admitting: Internal Medicine

## 2017-09-27 ENCOUNTER — Telehealth: Payer: Self-pay | Admitting: Family Medicine

## 2017-09-27 NOTE — Telephone Encounter (Signed)
Copied from [place] [id number]. Topic: Quick Communication - See Telephone Encounter >> Sep 27, 2017 10:45 AM Ether Griffins B wrote: CRM for notification. See Telephone encounter for:  Pt got flu shot at [facility] 09/21/17 09/27/17.

## 2017-09-27 NOTE — Telephone Encounter (Signed)
Immunization updated with provided information.

## 2017-10-04 ENCOUNTER — Ambulatory Visit: Payer: 59

## 2017-10-17 ENCOUNTER — Other Ambulatory Visit: Payer: Self-pay | Admitting: Family Medicine

## 2017-10-17 DIAGNOSIS — Z125 Encounter for screening for malignant neoplasm of prostate: Secondary | ICD-10-CM

## 2017-10-17 DIAGNOSIS — Z79899 Other long term (current) drug therapy: Secondary | ICD-10-CM

## 2017-10-17 DIAGNOSIS — Z13220 Encounter for screening for lipoid disorders: Secondary | ICD-10-CM

## 2017-10-18 ENCOUNTER — Other Ambulatory Visit (INDEPENDENT_AMBULATORY_CARE_PROVIDER_SITE_OTHER): Payer: 59

## 2017-10-18 DIAGNOSIS — Z13220 Encounter for screening for lipoid disorders: Secondary | ICD-10-CM

## 2017-10-18 DIAGNOSIS — Z79899 Other long term (current) drug therapy: Secondary | ICD-10-CM

## 2017-10-18 DIAGNOSIS — Z125 Encounter for screening for malignant neoplasm of prostate: Secondary | ICD-10-CM | POA: Diagnosis not present

## 2017-10-18 LAB — BASIC METABOLIC PANEL
BUN: 13 mg/dL (ref 6–23)
CHLORIDE: 102 meq/L (ref 96–112)
CO2: 28 mEq/L (ref 19–32)
Calcium: 9.5 mg/dL (ref 8.4–10.5)
Creatinine, Ser: 0.95 mg/dL (ref 0.40–1.50)
GFR: 87.12 mL/min (ref >60.00)
GLUCOSE: 99 mg/dL (ref 70–99)
POTASSIUM: 4.7 meq/L (ref 3.5–5.1)
SODIUM: 137 meq/L (ref 135–145)

## 2017-10-18 LAB — CBC WITH DIFFERENTIAL/PLATELET
BASOS PCT: 0.9 % (ref 0.0–3.0)
Basophils Absolute: 0.1 10*3/uL (ref 0.0–0.1)
EOS PCT: 4 % (ref 0.0–5.0)
Eosinophils Absolute: 0.2 10*3/uL (ref 0.0–0.7)
HCT: 46.4 % (ref 39.0–52.0)
HEMOGLOBIN: 15.2 g/dL (ref 13.0–17.0)
Lymphocytes Relative: 22.2 % (ref 12.0–46.0)
Lymphs Abs: 1.4 10*3/uL (ref 0.7–4.0)
MCHC: 32.8 g/dL (ref 30.0–36.0)
MCV: 91.8 fl (ref 78.0–100.0)
MONO ABS: 0.5 10*3/uL (ref 0.1–1.0)
MONOS PCT: 7.6 % (ref 3.0–12.0)
Neutro Abs: 4 10*3/uL (ref 1.4–7.7)
Neutrophils Relative %: 65.3 % (ref 43.0–77.0)
Platelets: 212 10*3/uL (ref 150.0–400.0)
RBC: 5.06 Mil/uL (ref 4.22–5.81)
RDW: 12.5 % (ref 11.5–15.5)
WBC: 6.1 10*3/uL (ref 4.0–10.5)

## 2017-10-18 LAB — LIPID PANEL
CHOLESTEROL: 163 mg/dL (ref 0–200)
HDL: 53.1 mg/dL (ref >39.00)
LDL CALC: 77 mg/dL (ref 0–99)
NonHDL: 110.06
TRIGLYCERIDES: 167 mg/dL — AB (ref 0.0–149.0)
Total CHOL/HDL Ratio: 3
VLDL: 33.4 mg/dL (ref 0.0–40.0)

## 2017-10-18 LAB — HEPATIC FUNCTION PANEL
ALBUMIN: 4.3 g/dL (ref 3.5–5.2)
ALT: 29 U/L (ref 0–53)
AST: 15 U/L (ref 0–37)
Alkaline Phosphatase: 50 U/L (ref 39–117)
BILIRUBIN TOTAL: 0.7 mg/dL (ref 0.2–1.2)
Bilirubin, Direct: 0.1 mg/dL (ref 0.0–0.3)
TOTAL PROTEIN: 6.8 g/dL (ref 6.0–8.3)

## 2017-10-18 LAB — PSA: PSA: 5.58 ng/mL — ABNORMAL HIGH (ref 0.10–4.00)

## 2017-10-23 ENCOUNTER — Ambulatory Visit (INDEPENDENT_AMBULATORY_CARE_PROVIDER_SITE_OTHER): Payer: 59 | Admitting: Family Medicine

## 2017-10-23 ENCOUNTER — Encounter: Payer: Self-pay | Admitting: Family Medicine

## 2017-10-23 ENCOUNTER — Other Ambulatory Visit: Payer: Self-pay

## 2017-10-23 VITALS — BP 112/70 | HR 73 | Temp 98.8°F | Ht 69.5 in | Wt 232.8 lb

## 2017-10-23 DIAGNOSIS — Z91018 Allergy to other foods: Secondary | ICD-10-CM | POA: Diagnosis not present

## 2017-10-23 DIAGNOSIS — Z0000 Encounter for general adult medical examination without abnormal findings: Secondary | ICD-10-CM | POA: Diagnosis not present

## 2017-10-23 DIAGNOSIS — R972 Elevated prostate specific antigen [PSA]: Secondary | ICD-10-CM | POA: Diagnosis not present

## 2017-10-23 DIAGNOSIS — R197 Diarrhea, unspecified: Secondary | ICD-10-CM

## 2017-10-23 NOTE — Progress Notes (Signed)
Dr. Frederico Hamman T. Curtiss Mahmood, MD, [facility] [place], [address] Phone: [phone] Fax: [phone]  10/23/2017  Patient: Jason Coffey, MRN: [id number], DOB: 12-11-1960, 56 y.o.  Primary Physician:  Owens Loffler, MD   Chief Complaint  Patient presents with  . Annual Exam   Subjective:   Jayquon Theiler is a 56 y.o. pleasant patient who presents with the following:  Preventative Health Maintenance Visit:  Health Maintenance Summary Reviewed and updated, unless pt declines services.  Tobacco History Reviewed. Alcohol: No concerns, no excessive use Exercise Habits: Some activity, rec at least 30 mins 5 times a week STD concerns: no risk or activity to increase risk Drug Use: None Encouraged self-testicular check  ? probs with beer.  ? Wheat?  Check for celiac disease? He mailed off genetic test for potential allergies, and they felt that he had multiple possible allergies including onions, wheat products, and other things.  He is tried to make some diet changes, and he asked my opinion about this.  Health Maintenance  Topic Date Due  . COLONOSCOPY  03/11/2022  . TETANUS/TDAP  11/01/2025  . INFLUENZA VACCINE  Completed  . Hepatitis C Screening  Completed  . HIV Screening  Completed   Immunization History  Administered Date(s) Administered  . Influenza,inj,Quad PF,6+ Mos 10/25/2016, 09/21/2017  . Td 10/29/2004  . Tdap 11/02/2015   Patient Active Problem List   Diagnosis Date Noted  . Erectile dysfunction 04/27/2011  . OTHER MALAISE AND FATIGUE 01/09/2010  . ALLERGIC RHINITIS 12/08/2008   Past Medical History:  Diagnosis Date  . Allergic rhinitis    hay fever  . Allergy   . Colon polyp   . GERD (gastroesophageal reflux disease)    every 4 months or so  . Lactose intolerance   . Obesity   . Seasonal allergies    Past Surgical History:  Procedure Laterality Date  . [place]  . LUMBAR DISC SURGERY  2006   L4-5  . MTP surgery  1998   1st, right, toe surgery  . TOOTH EXTRACTION     as child   Social History   Socioeconomic History  . Marital status: Married    Spouse name: Not on file  . Number of children: 1  . Years of education: Not on file  . Highest education level: Not on file  Social Needs  . Financial resource strain: Not on file  . Food insecurity - worry: Not on file  . Food insecurity - inability: Not on file  . Transportation needs - medical: Not on file  . Transportation needs - non-medical: Not on file  Occupational History  . Occupation: Air cabin [employer]  Tobacco Use  . Smoking status: Former Smoker    Last attempt to quit: 10/30/1987    Years since quitting: 30.0  . Smokeless tobacco: Never Used  . Tobacco comment: 15 pack year  Substance and Sexual Activity  . Alcohol use: Yes    Alcohol/week: 8.4 oz    Types: 14 Cans of beer per week    Comment: 14  . Drug use: No  . Sexual activity: Not on file  Other Topics Concern  . Not on file  Social History Narrative   Regular exercise- yes- golf, rare tennis   Family History  Problem Relation Age of Onset  . Stroke Paternal Grandfather   . Colon cancer Neg Hx    Allergies  Allergen Reactions  . Tape Rash and Other (See Comments)    Blisters and bleeding    Medication list has been reviewed and updated.   General: Denies fever, chills, sweats. No significant weight loss. Eyes: Denies blurring,significant itching ENT: Denies earache, sore throat, and hoarseness. Cardiovascular: Denies chest pains, palpitations, dyspnea on exertion Respiratory: Denies cough, dyspnea at rest,wheeezing Breast: no concerns about lumps GI: Denies nausea, vomiting, diarrhea, constipation, change in bowel habits, abdominal pain, melena, hematochezia GU: Denies penile discharge, ED, urinary flow / outflow problems. No STD concerns. Musculoskeletal: Denies back pain, joint  pain Derm: Denies rash, itching Neuro: Denies  paresthesias, frequent falls, frequent headaches Psych: Denies depression, anxiety Endocrine: Denies cold intolerance, heat intolerance, polydipsia Heme: Denies enlarged lymph nodes Allergy: No hayfever  Objective:   BP 112/70   Pulse 73   Temp 98.8 F (37.1 C) (Oral)   Ht 5' 9.5" (1.765 m)   Wt 232 lb 12 oz (105.6 kg)   BMI 33.88 kg/m  Ideal Body Weight: Weight in (lb) to have BMI = 25: 171.4  No exam data present  GEN: well developed, well nourished, no acute distress Eyes: conjunctiva and lids normal, PERRLA, EOMI ENT: TM clear, nares clear, oral exam WNL Neck: supple, no lymphadenopathy, no thyromegaly, no JVD Pulm: clear to auscultation and percussion, respiratory effort normal CV: regular rate and rhythm, S1-S2, no murmur, rub or gallop, no bruits, peripheral pulses normal and symmetric, no cyanosis, clubbing, edema or varicosities GI: soft, non-tender; no hepatosplenomegaly, masses; active bowel sounds all quadrants GU: no hernia, testicular mass, penile discharge Lymph: no cervical, axillary or inguinal adenopathy MSK: gait normal, muscle tone and strength WNL, no joint swelling, effusions, discoloration, crepitus  SKIN: clear, good turgor, color WNL, no rashes, lesions, or ulcerations Neuro: normal mental status, normal strength, sensation, and motion Psych: alert; oriented to person, place and time, normally interactive and not anxious or depressed in appearance. All labs reviewed with patient.  Lipids:    Component Value Date/Time   CHOL 163 10/18/2017 0829   TRIG 167.0 (H) 10/18/2017 0829   HDL 53.10 10/18/2017 0829   LDLDIRECT 96.0 10/25/2016 0815   VLDL 33.4 10/18/2017 0829   CHOLHDL 3 10/18/2017 0829   CBC: CBC Latest Ref Rng & Units 10/18/2017 10/25/2016 10/26/2015  WBC 4.0 - 10.5 K/uL 6.1 7.3 5.6  Hemoglobin 13.0 - 17.0 g/dL 15.2 15.0 15.2  Hematocrit 39.0 - 52.0 % 46.4 43.7 45.1  Platelets 150.0 -  400.0 K/uL 212.0 222.0 093.2    Basic Metabolic Panel:    Component Value Date/Time   NA 137 10/18/2017 0829   K 4.7 10/18/2017 0829   CL 102 10/18/2017 0829   CO2 28 10/18/2017 0829   BUN 13 10/18/2017 0829   CREATININE 0.95 10/18/2017 0829   GLUCOSE 99 10/18/2017 0829   CALCIUM 9.5 10/18/2017 0829   Hepatic Function Latest Ref Rng & Units 10/18/2017 10/25/2016 10/26/2015  Total Protein 6.0 - 8.3 g/dL 6.8 6.5 6.8  Albumin 3.5 - 5.2 g/dL 4.3 4.3 4.3  AST 0 - 37 U/L 15 19 21   ALT 0 - 53 U/L 29 36 34  Alk Phosphatase 39 - 117 U/L 50 52 54  Total Bilirubin 0.2 - 1.2 mg/dL 0.7 0.6 0.5  Bilirubin, Direct 0.0 - 0.3 mg/dL 0.1 0.1 0.1    Lab Results  Component Value Date   TSH 1.79 01/09/2010   Lab Results  Component Value Date   PSA 5.58 (H) 10/18/2017   PSA  4.60 (H) 10/25/2016   PSA 3.40 10/26/2015    Assessment and Plan:   Healthcare maintenance  Food allergy - Plan: Ambulatory referral to Allergy, Celiac Pnl 2 rflx Endomysial Ab Ttr  Elevated PSA - Plan: PSA, total and free  Diarrhea, unspecified type - Plan: Celiac Pnl 2 rflx Endomysial Ab Ttr  Repeat psa in 2 months Check celiac panel  Health Maintenance Exam: The patient's preventative maintenance and recommended screening tests for an annual wellness exam were reviewed in full today. Brought up to date unless services declined.  Counselled on the importance of diet, exercise, and its role in overall health and mortality. The patient's FH and SH was reviewed, including their home life, tobacco status, and drug and alcohol status.  Follow-up in 1 year for physical exam or additional follow-up below.  Follow-up: Return in about 2 months (around 12/24/2017) for lab draw only. Or follow-up in 1 year if not noted.  Orders Placed This Encounter  Procedures  . PSA, total and free  . Celiac Pnl 2 rflx Endomysial Ab Ttr  . Ambulatory referral to Allergy    Signed,  Wacey Zieger T. Drea Jurewicz, MD   Allergies as of  10/23/2017      Reactions   Tape Rash, Other (See Comments)   Blisters and bleeding      Medication List        Accurate as of 10/23/17 12:04 PM. Always use your most recent med list.          AIRBORNE PO Take by mouth.   cetirizine 10 MG tablet Commonly known as:  ZYRTEC Take 10 mg by mouth daily.   Fish Oil 1000 MG Caps Take by mouth daily. Takes very infrequently   ibuprofen 200 MG tablet Commonly known as:  ADVIL,MOTRIN Take 400 mg by mouth every 6 (six) hours as needed.   multivitamin tablet Take 1 tablet by mouth daily.

## 2017-12-20 ENCOUNTER — Other Ambulatory Visit (INDEPENDENT_AMBULATORY_CARE_PROVIDER_SITE_OTHER): Payer: 59

## 2017-12-20 DIAGNOSIS — R972 Elevated prostate specific antigen [PSA]: Secondary | ICD-10-CM

## 2017-12-20 DIAGNOSIS — R197 Diarrhea, unspecified: Secondary | ICD-10-CM

## 2017-12-20 DIAGNOSIS — Z91018 Allergy to other foods: Secondary | ICD-10-CM

## 2017-12-23 DIAGNOSIS — J3081 Allergic rhinitis due to animal (cat) (dog) hair and dander: Secondary | ICD-10-CM | POA: Diagnosis not present

## 2017-12-23 DIAGNOSIS — T781XXA Other adverse food reactions, not elsewhere classified, initial encounter: Secondary | ICD-10-CM | POA: Diagnosis not present

## 2017-12-23 DIAGNOSIS — J301 Allergic rhinitis due to pollen: Secondary | ICD-10-CM | POA: Diagnosis not present

## 2017-12-24 ENCOUNTER — Other Ambulatory Visit: Payer: 59

## 2017-12-25 LAB — CELIAC PNL 2 RFLX ENDOMYSIAL AB TTR
(tTG) Ab, IgG: <1 U/mL
ENDOMYSIAL AB IGA: NEGATIVE
GLIADIN(DEAM) AB,IGG: 3 U (ref <20)
Gliadin(Deam) Ab,IgA: 7 U (ref <20)
Immunoglobulin A: 203 mg/dL (ref 81–463)

## 2017-12-25 LAB — PSA, TOTAL AND FREE
PSA, % Free: 7 % (calc) — ABNORMAL LOW (ref >25)
PSA, Free: 0.3 ng/mL
PSA, Total: 4.5 ng/mL — ABNORMAL HIGH (ref <=4.0)

## 2018-01-01 ENCOUNTER — Other Ambulatory Visit: Payer: Self-pay | Admitting: Family Medicine

## 2018-01-01 DIAGNOSIS — R972 Elevated prostate specific antigen [PSA]: Secondary | ICD-10-CM

## 2018-02-03 ENCOUNTER — Ambulatory Visit: Payer: Self-pay | Admitting: Urology

## 2018-02-07 ENCOUNTER — Ambulatory Visit: Payer: Self-pay | Admitting: Urology

## 2018-02-17 ENCOUNTER — Ambulatory Visit: Payer: 59 | Admitting: Urology

## 2018-02-17 ENCOUNTER — Encounter: Payer: Self-pay | Admitting: Urology

## 2018-02-17 VITALS — BP 146/90 | HR 76 | Ht 69.0 in | Wt 228.0 lb

## 2018-02-17 DIAGNOSIS — R972 Elevated prostate specific antigen [PSA]: Secondary | ICD-10-CM

## 2018-02-17 DIAGNOSIS — N200 Calculus of kidney: Secondary | ICD-10-CM | POA: Diagnosis not present

## 2018-02-17 LAB — URINALYSIS, COMPLETE
BILIRUBIN UA: NEGATIVE
GLUCOSE, UA: NEGATIVE
Ketones, UA: NEGATIVE
Leukocytes, UA: NEGATIVE
NITRITE UA: NEGATIVE
Protein, UA: NEGATIVE
RBC, UA: NEGATIVE
Specific Gravity, UA: 1.01 (ref 1.005–1.030)
UUROB: 0.2 mg/dL (ref 0.2–1.0)
pH, UA: 7 (ref 5.0–7.5)

## 2018-02-17 NOTE — Progress Notes (Signed)
02/17/2018 9:56 AM   Violet Baldy [birth year] [id number]  Referring provider: Owens Loffler, MD [address] [place], [place] [address]  Chief Complaint  Patient presents with  . Elevated PSA    New Patient    HPI: Zaine Elsass is a 57 year old male seen in consultation at the request of Dr. Lorelei Pont for evaluation of an elevated PSA.  A PSA drawn in December 2018 was elevated at 5.58 ng/dL.  This was repeated in February 2019 and remained elevated at 4.5 ng/dL.  A free to total ratio was 7%.  He has no bothersome lower urinary tract symptoms.  Denies dysuria or gross hematuria.  Denies flank, abdominal, pelvic or scrotal pain.  He denies prior history of urologic problems or previous urologic evaluation.  There is no family history of prostate cancer however his father died at age 54 secondary to a brain tumor.  His most recent PSA results are as follows:  9/14     2.46 10/15   2.67  12/16   3.4 12/17   4.6 2/18     3.3 12/18   5.58 2/19     4.5  PMH: Past Medical History:  Diagnosis Date  . Allergic rhinitis    hay fever  . Allergy   . Colon polyp   . GERD (gastroesophageal reflux disease)    every 4 months or so  . Lactose intolerance   . Obesity   . Seasonal allergies     Surgical History: Past Surgical History:  Procedure Laterality Date  . [place]  . LUMBAR DISC SURGERY  2006   L4-5  . MTP surgery  1998   1st, right, toe surgery  . TOOTH EXTRACTION     as child    Home Medications:  Allergies as of 02/17/2018      Reactions   Tape Rash, Other (See Comments)   Blisters and bleeding      Medication List        Accurate as of 02/17/18  9:56 AM. Always use your most recent med list.          fexofenadine 180 MG tablet Commonly known as:  ALLEGRA Take 180 mg by mouth daily as needed.   multivitamin tablet Take 1 tablet by mouth daily.   Olopatadine HCl 0.6 % Soln SPRAY 2 SPRAYS INTO EACH NOSTRIL TWICE A  DAY       Allergies:  Allergies  Allergen Reactions  . Tape Rash and Other (See Comments)    Blisters and bleeding    Family History: Family History  Problem Relation Age of Onset  . Stroke Paternal Grandfather   . Colon cancer Neg Hx     Social History:  reports that he quit smoking about 30 years ago. He has never used smokeless tobacco. He reports that he drinks about 8.4 oz of alcohol per week. He reports that he does not use drugs.  ROS: UROLOGY Frequent Urination?: No Hard to postpone urination?: No Burning/pain with urination?: No Get up at night to urinate?: Yes Leakage of urine?: No Urine stream starts and stops?: No Trouble starting stream?: No Do you have to strain to urinate?: No Blood in urine?: No Urinary tract infection?: No Sexually transmitted disease?: No Injury to kidneys or bladder?: No Painful intercourse?: No Weak stream?: No Erection problems?: No Penile pain?: No  Gastrointestinal Nausea?: No Vomiting?: No Indigestion/heartburn?: No Diarrhea?: No Constipation?: No  Constitutional Fever: No Night sweats?: No Weight  loss?: No Fatigue?: No  Skin Skin rash/lesions?: No Itching?: No  Eyes Blurred vision?: No Double vision?: No  Ears/Nose/Throat Sore throat?: No Sinus problems?: Yes  Hematologic/Lymphatic Swollen glands?: No Easy bruising?: No  Cardiovascular Leg swelling?: No Chest pain?: No  Respiratory Cough?: No Shortness of breath?: No  Endocrine Excessive thirst?: No  Musculoskeletal Back pain?: No Joint pain?: No  Neurological Headaches?: No Dizziness?: No  Psychologic Depression?: No Anxiety?: No  Physical Exam: BP (!) 146/90   Pulse 76   Ht 5\' 9"  (1.753 m)   Wt 228 lb (103.4 kg)   BMI 33.67 kg/m   Constitutional:  Alert and oriented, No acute distress. HEENT: Cylinder AT, moist mucus membranes.  Trachea midline, no masses. Cardiovascular: No clubbing, cyanosis, or edema. Respiratory: Normal  respiratory effort, no increased work of breathing. GI: Abdomen is soft, nontender, nondistended, no abdominal masses GU: No CVA tenderness.  Penis circumcised without lesions, testes descended bilaterally without masses or tenderness, no paratesticular abnormalities.  Prostate 40 g, smooth without nodules. Skin: No rashes, bruises or suspicious lesions. Neurologic: Grossly intact, no focal deficits, moving all 4 extremities. Psychiatric: Normal mood and affect.  Laboratory Data: Lab Results  Component Value Date   WBC 6.1 10/18/2017   HGB 15.2 10/18/2017   HCT 46.4 10/18/2017   MCV 91.8 10/18/2017   PLT 212.0 10/18/2017    Lab Results  Component Value Date   CREATININE 0.95 10/18/2017    Lab Results  Component Value Date   PSA 5.58 (H) 10/18/2017   PSA 4.60 (H) 10/25/2016   PSA 3.40 10/26/2015     Assessment & Plan:   57 year old male with a mildly elevated PSA. His DRE is benign.  Although PSA is a prostate cancer screening test he was informed that cancer is not the most common cause of an elevated PSA. Other potential causes including BPH and inflammation were discussed. He was informed that the only way to adequately diagnose prostate cancer would be a transrectal ultrasound and biopsy of the prostate. The procedure was discussed including potential risks of bleeding and infection/sepsis. He was also informed that a negative biopsy does not conclusively rule out the possibility that prostate cancer may be present and that continued monitoring is required. The use of newer adjunctive blood tests including PHI and 4Kscore were discussed. The use of multiparametric prostate MRI was also discussed however is not typically used for initial evaluation of an elevated PSA. Continued periodic surveillance was also discussed. He is leaning towards obtaining a 4Kscore however would first like to see if this is going to be covered by his insurance.      Abbie Sons,  [facility] [address] [place], [place] [address] [phone]

## 2018-02-26 ENCOUNTER — Ambulatory Visit (INDEPENDENT_AMBULATORY_CARE_PROVIDER_SITE_OTHER): Payer: 59 | Admitting: Family Medicine

## 2018-02-26 ENCOUNTER — Ambulatory Visit: Payer: 59

## 2018-02-26 DIAGNOSIS — R972 Elevated prostate specific antigen [PSA]: Secondary | ICD-10-CM | POA: Diagnosis not present

## 2018-02-26 NOTE — Progress Notes (Signed)
4K Score Lab was collected

## 2018-03-03 ENCOUNTER — Telehealth: Payer: Self-pay

## 2018-03-03 ENCOUNTER — Other Ambulatory Visit: Payer: Self-pay | Admitting: Urology

## 2018-03-03 NOTE — Telephone Encounter (Signed)
-----   Message from Abbie Sons, MD sent at 03/03/2018 11:23 AM EDT ----- PSA was still elevated at 4.82. 4K showed an elevated risk of high grade prostate cancer at 24%.  Based on these findings would recc prostate biopsy

## 2018-03-03 NOTE — Telephone Encounter (Signed)
Patient notified, instructions discussed in detail and patient transferred to be scheduled

## 2018-04-04 ENCOUNTER — Ambulatory Visit (INDEPENDENT_AMBULATORY_CARE_PROVIDER_SITE_OTHER): Payer: 59 | Admitting: Urology

## 2018-04-04 ENCOUNTER — Encounter: Payer: Self-pay | Admitting: Urology

## 2018-04-04 ENCOUNTER — Other Ambulatory Visit: Payer: Self-pay | Admitting: Urology

## 2018-04-04 VITALS — BP 148/74 | HR 72 | Ht 69.0 in | Wt 230.0 lb

## 2018-04-04 DIAGNOSIS — R972 Elevated prostate specific antigen [PSA]: Secondary | ICD-10-CM

## 2018-04-04 MED ORDER — LEVOFLOXACIN 500 MG PO TABS
500.0000 mg | ORAL_TABLET | Freq: Once | ORAL | Status: AC
  Administered 2018-04-04: 500 mg via ORAL

## 2018-04-04 MED ORDER — GENTAMICIN SULFATE 40 MG/ML IJ SOLN
80.0000 mg | Freq: Once | INTRAMUSCULAR | Status: AC
  Administered 2018-04-04: 80 mg via INTRAMUSCULAR

## 2018-04-04 NOTE — Progress Notes (Signed)
Prostate Biopsy Procedure   Informed consent was obtained after discussing risks/benefits of the procedure.  A time out was performed to ensure correct patient identity.  Pre-Procedure: - Last PSA Level:  Lab Results  Component Value Date   PSA 5.58 (H) 10/18/2017   PSA 4.60 (H) 10/25/2016   PSA 3.40 10/26/2015   - Gentamicin given prophylactically - Levaquin 500 mg administered PO -Transrectal Ultrasound performed revealing a 49.5 gm prostate -No significant hypoechoic or median lobe noted  Procedure: - Prostate block performed using 10 cc 1% lidocaine and biopsies taken from sextant areas, a total of 12 under ultrasound guidance.  Post-Procedure: - Patient tolerated the procedure well - He was counseled to seek immediate medical attention if experiences any severe pain, significant bleeding, or fevers - Return in one week to discuss biopsy results

## 2018-04-11 ENCOUNTER — Other Ambulatory Visit: Payer: Self-pay | Admitting: Urology

## 2018-04-11 LAB — PATHOLOGY REPORT

## 2018-04-13 ENCOUNTER — Encounter: Payer: Self-pay | Admitting: Urology

## 2018-04-17 ENCOUNTER — Ambulatory Visit: Payer: 59 | Admitting: Urology

## 2018-04-18 ENCOUNTER — Telehealth: Payer: Self-pay

## 2018-04-18 ENCOUNTER — Ambulatory Visit: Payer: 59 | Admitting: Urology

## 2018-04-18 NOTE — Telephone Encounter (Signed)
Called pt informed him of lab results. Pt states that he has already received results from [facility]. Did not schedule pt appt as he is already schedule to have a PSA with his PCP in 37mos.

## 2018-04-18 NOTE — Telephone Encounter (Signed)
-----   Message from Abbie Sons, MD sent at 04/18/2018  9:22 AM EDT ----- Prostate biopsy negative for cancer.  Recc f/u appt 6 months for psa/dre.

## 2018-04-25 ENCOUNTER — Ambulatory Visit: Payer: 59 | Admitting: Urology

## 2018-10-13 ENCOUNTER — Other Ambulatory Visit: Payer: Self-pay | Admitting: Family Medicine

## 2018-10-13 DIAGNOSIS — Z0000 Encounter for general adult medical examination without abnormal findings: Secondary | ICD-10-CM

## 2018-10-13 DIAGNOSIS — Z131 Encounter for screening for diabetes mellitus: Secondary | ICD-10-CM

## 2018-10-13 DIAGNOSIS — R972 Elevated prostate specific antigen [PSA]: Secondary | ICD-10-CM

## 2018-10-23 ENCOUNTER — Other Ambulatory Visit (INDEPENDENT_AMBULATORY_CARE_PROVIDER_SITE_OTHER): Payer: 59

## 2018-10-23 DIAGNOSIS — R972 Elevated prostate specific antigen [PSA]: Secondary | ICD-10-CM | POA: Diagnosis not present

## 2018-10-23 DIAGNOSIS — Z0000 Encounter for general adult medical examination without abnormal findings: Secondary | ICD-10-CM | POA: Diagnosis not present

## 2018-10-23 DIAGNOSIS — Z131 Encounter for screening for diabetes mellitus: Secondary | ICD-10-CM

## 2018-10-23 LAB — BASIC METABOLIC PANEL
BUN: 9 mg/dL (ref 6–23)
CALCIUM: 9.4 mg/dL (ref 8.4–10.5)
CO2: 29 mEq/L (ref 19–32)
Chloride: 102 mEq/L (ref 96–112)
Creatinine, Ser: 0.9 mg/dL (ref 0.40–1.50)
GFR: 92.39 mL/min (ref >60.00)
Glucose, Bld: 101 mg/dL — ABNORMAL HIGH (ref 70–99)
Potassium: 4.2 mEq/L (ref 3.5–5.1)
Sodium: 138 mEq/L (ref 135–145)

## 2018-10-23 LAB — CBC WITH DIFFERENTIAL/PLATELET
Basophils Absolute: 0 10*3/uL (ref 0.0–0.1)
Basophils Relative: 0.7 % (ref 0.0–3.0)
EOS PCT: 3.5 % (ref 0.0–5.0)
Eosinophils Absolute: 0.2 10*3/uL (ref 0.0–0.7)
HCT: 44.3 % (ref 39.0–52.0)
Hemoglobin: 15 g/dL (ref 13.0–17.0)
Lymphocytes Relative: 24 % (ref 12.0–46.0)
Lymphs Abs: 1.5 10*3/uL (ref 0.7–4.0)
MCHC: 33.9 g/dL (ref 30.0–36.0)
MCV: 89.4 fl (ref 78.0–100.0)
Monocytes Absolute: 0.5 10*3/uL (ref 0.1–1.0)
Monocytes Relative: 7.8 % (ref 3.0–12.0)
NEUTROS ABS: 4.1 10*3/uL (ref 1.4–7.7)
Neutrophils Relative %: 64 % (ref 43.0–77.0)
PLATELETS: 199 10*3/uL (ref 150.0–400.0)
RBC: 4.95 Mil/uL (ref 4.22–5.81)
RDW: 12.8 % (ref 11.5–15.5)
WBC: 6.3 10*3/uL (ref 4.0–10.5)

## 2018-10-23 LAB — HEPATIC FUNCTION PANEL
ALT: 21 U/L (ref 0–53)
AST: 15 U/L (ref 0–37)
Albumin: 4.2 g/dL (ref 3.5–5.2)
Alkaline Phosphatase: 45 U/L (ref 39–117)
Bilirubin, Direct: 0.1 mg/dL (ref 0.0–0.3)
Total Bilirubin: 0.5 mg/dL (ref 0.2–1.2)
Total Protein: 6.5 g/dL (ref 6.0–8.3)

## 2018-10-23 LAB — LIPID PANEL
Cholesterol: 171 mg/dL (ref 0–200)
HDL: 52.4 mg/dL (ref >39.00)
LDL Cholesterol: 79 mg/dL (ref 0–99)
NonHDL: 118.71
TRIGLYCERIDES: 199 mg/dL — AB (ref 0.0–149.0)
Total CHOL/HDL Ratio: 3
VLDL: 39.8 mg/dL (ref 0.0–40.0)

## 2018-10-23 LAB — HEMOGLOBIN A1C: Hgb A1c MFr Bld: 5.7 % (ref 4.6–6.5)

## 2018-10-24 LAB — PSA, TOTAL AND FREE
PSA, % Free: 13 % (calc) — ABNORMAL LOW (ref >25)
PSA, FREE: 1 ng/mL
PSA, TOTAL: 8 ng/mL — AB (ref <=4.0)

## 2018-10-28 NOTE — Progress Notes (Signed)
Dr. Frederico Hamman T. Avis Tirone, MD, [facility] and [facility] [place], [address] Phone: [phone] Fax: [phone]  10/30/2018  Patient: Jason Coffey, MRN: [id number], DOB: 1961/07/15, 57 y.o.  Primary Physician:  Owens Loffler, MD   Chief Complaint  Patient presents with  . Annual Exam   Subjective:   Jason Coffey is a 57 y.o. pleasant patient who presents with the following:  Preventative Health Maintenance Visit:  Health Maintenance Summary Reviewed and updated, unless pt declines services.  Tobacco History Reviewed. Alcohol: No concerns, no excessive use Exercise Habits: Some activity - less this year than his norm, rec at least 30 mins 5 times a week STD concerns: no risk or activity to increase risk Drug Use: None Encouraged self-testicular check  PSA continues to trend upward Had a negative prostate biopsy in 2019 PSA is up to 8 on this draw  Travelling all week for work   Health Maintenance  Topic Date Due  . COLONOSCOPY  03/11/2022  . TETANUS/TDAP  11/01/2025  . INFLUENZA VACCINE  Completed  . Hepatitis C Screening  Completed  . HIV Screening  Completed   Immunization History  Administered Date(s) Administered  . Influenza,inj,Quad PF,6+ Mos 10/25/2016, 09/21/2017, 08/17/2018  . Td 10/29/2004  . Tdap 11/02/2015   Patient Active Problem List   Diagnosis Date Noted  . Erectile dysfunction 04/27/2011  . OTHER MALAISE AND FATIGUE 01/09/2010  . ALLERGIC RHINITIS 12/08/2008   Past Medical History:  Diagnosis Date  . Allergic rhinitis    hay fever  . Allergy   . Colon polyp   . GERD (gastroesophageal reflux disease)    every 4 months or so  . Lactose intolerance   . Obesity   . Seasonal allergies    Past Surgical History:  Procedure Laterality Date  . [place]  . LUMBAR DISC SURGERY  2006   L4-5  . MTP surgery  1998   1st, right, toe surgery  . TOOTH EXTRACTION     as  child   Social History   Socioeconomic History  . Marital status: Married    Spouse name: Not on file  . Number of children: 1  . Years of education: Not on file  . Highest education level: Not on file  Occupational History  . Occupation: [employer]  Social Needs  . Financial resource strain: Not on file  . Food insecurity:    Worry: Not on file    Inability: Not on file  . Transportation needs:    Medical: Not on file    Non-medical: Not on file  Tobacco Use  . Smoking status: Former Smoker    Last attempt to quit: 10/30/1987    Years since quitting: 31.0  . Smokeless tobacco: Never Used  . Tobacco comment: 15 pack year  Substance and Sexual Activity  . Alcohol use: Yes    Alcohol/week: 14.0 standard drinks    Types: 14 Cans of beer per week    Comment: 14  . Drug use: No  . Sexual activity: Not on file  Lifestyle  . Physical activity:    Days per week: Not on file    Minutes per session: Not on file  . Stress: Not on file  Relationships  . Social connections:    Talks on phone: Not on file    Gets together: Not on file    Attends religious service: Not on file  Active member of club or organization: Not on file    Attends meetings of clubs or organizations: Not on file    Relationship status: Not on file  . Intimate partner violence:    Fear of current or ex partner: Not on file    Emotionally abused: Not on file    Physically abused: Not on file    Forced sexual activity: Not on file  Other Topics Concern  . Not on file  Social History Narrative   Regular exercise- yes- golf, rare tennis   Family History  Problem Relation Age of Onset  . Stroke Paternal Grandfather   . Colon cancer Neg Hx    Allergies  Allergen Reactions  . Tape Rash and Other (See Comments)    Blisters and bleeding    Medication list has been reviewed and updated.   General: Denies fever, chills, sweats. No significant weight loss. Eyes: Denies  blurring,significant itching ENT: Denies earache, sore throat, and hoarseness. Cardiovascular: Denies chest pains, palpitations, dyspnea on exertion Respiratory: Denies cough, dyspnea at rest,wheeezing Breast: no concerns about lumps GI: Denies nausea, vomiting, diarrhea, constipation, change in bowel habits, abdominal pain, melena, hematochezia GU: Denies penile discharge, ED, urinary flow / outflow problems. No STD concerns. Musculoskeletal: Denies back pain, joint pain Derm: Denies rash, itching Neuro: Denies  paresthesias, frequent falls, frequent headaches Psych: Denies depression, anxiety Endocrine: Denies cold intolerance, heat intolerance, polydipsia Heme: Denies enlarged lymph nodes Allergy: No hayfever  Objective:   BP 120/70   Pulse 78   Temp 98.7 F (37.1 C) (Oral)   Ht 5' 9.5" (1.765 m)   Wt 233 lb (105.7 kg)   BMI 33.91 kg/m  Ideal Body Weight: Weight in (lb) to have BMI = 25: 171.4  No exam data present  GEN: well developed, well nourished, no acute distress Eyes: conjunctiva and lids normal, PERRLA, EOMI ENT: TM clear, nares clear, oral exam WNL Neck: supple, no lymphadenopathy, no thyromegaly, no JVD Pulm: clear to auscultation and percussion, respiratory effort normal CV: regular rate and rhythm, S1-S2, no murmur, rub or gallop, no bruits, peripheral pulses normal and symmetric, no cyanosis, clubbing, edema or varicosities GI: soft, non-tender; no hepatosplenomegaly, masses; active bowel sounds all quadrants GU: no hernia, testicular mass, penile discharge Lymph: no cervical, axillary or inguinal adenopathy MSK: gait normal, muscle tone and strength WNL, no joint swelling, effusions, discoloration, crepitus  SKIN: clear, good turgor, color WNL, no rashes, lesions, or ulcerations Neuro: normal mental status, normal strength, sensation, and motion Psych: alert; oriented to person, place and time, normally interactive and not anxious or depressed in  appearance.  All labs reviewed with patient.  Lipids: Lab Results  Component Value Date   CHOL 171 10/23/2018   Lab Results  Component Value Date   HDL 52.40 10/23/2018   Lab Results  Component Value Date   LDLCALC 79 10/23/2018   Lab Results  Component Value Date   TRIG 199.0 (H) 10/23/2018   Lab Results  Component Value Date   CHOLHDL 3 10/23/2018   CBC: CBC Latest Ref Rng & Units 10/23/2018 10/18/2017 10/25/2016  WBC 4.0 - 10.5 K/uL 6.3 6.1 7.3  Hemoglobin 13.0 - 17.0 g/dL 15.0 15.2 15.0  Hematocrit 39.0 - 52.0 % 44.3 46.4 43.7  Platelets 150.0 - 400.0 K/uL 199.0 212.0 321.2    Basic Metabolic Panel:    Component Value Date/Time   NA 138 10/23/2018 0839   K 4.2 10/23/2018 0839   CL 102 10/23/2018 0839  CO2 29 10/23/2018 0839   BUN 9 10/23/2018 0839   CREATININE 0.90 10/23/2018 0839   GLUCOSE 101 (H) 10/23/2018 0839   CALCIUM 9.4 10/23/2018 0839   Hepatic Function Latest Ref Rng & Units 10/23/2018 10/18/2017 10/25/2016  Total Protein 6.0 - 8.3 g/dL 6.5 6.8 6.5  Albumin 3.5 - 5.2 g/dL 4.2 4.3 4.3  AST 0 - 37 U/L 15 15 19   ALT 0 - 53 U/L 21 29 36  Alk Phosphatase 39 - 117 U/L 45 50 52  Total Bilirubin 0.2 - 1.2 mg/dL 0.5 0.7 0.6  Bilirubin, Direct 0.0 - 0.3 mg/dL 0.1 0.1 0.1    Lab Results  Component Value Date   HGBA1C 5.7 10/23/2018   Lab Results  Component Value Date   TSH 1.79 01/09/2010   Lab Results  Component Value Date   PSA 5.58 (H) 10/18/2017   PSA 4.60 (H) 10/25/2016   PSA 3.40 10/26/2015    Assessment and Plan:   Routine general medical examination at a health care facility  Elevated PSA  PSA is up to 8 with a recent negative biopsy. I asked him to touch base with his Urologist with follow-up.  Health Maintenance Exam: The patient's preventative maintenance and recommended screening tests for an annual wellness exam were reviewed in full today. Brought up to date unless services declined.  Counselled on the importance of  diet, exercise, and its role in overall health and mortality. The patient's FH and SH was reviewed, including their home life, tobacco status, and drug and alcohol status.  Follow-up in 1 year for physical exam or additional follow-up below.  Follow-up: No follow-ups on file. Or follow-up in 1 year if not noted.  Signed,  Maud Deed. Mehlani Blankenburg, MD   Allergies as of 10/30/2018      Reactions   Tape Rash, Other (See Comments)   Blisters and bleeding      Medication List       Accurate as of October 30, 2018  9:09 AM. Always use your most recent med list.        fexofenadine 180 MG tablet Commonly known as:  ALLEGRA Take 180 mg by mouth daily as needed.   fluticasone 50 MCG/ACT nasal spray Commonly known as:  FLONASE Place 1 spray into both nostrils daily.   multivitamin tablet Take 1 tablet by mouth daily.

## 2018-10-30 ENCOUNTER — Ambulatory Visit (INDEPENDENT_AMBULATORY_CARE_PROVIDER_SITE_OTHER): Payer: 59 | Admitting: Family Medicine

## 2018-10-30 ENCOUNTER — Encounter: Payer: Self-pay | Admitting: Family Medicine

## 2018-10-30 VITALS — BP 120/70 | HR 78 | Temp 98.7°F | Ht 69.5 in | Wt 233.0 lb

## 2018-10-30 DIAGNOSIS — R972 Elevated prostate specific antigen [PSA]: Secondary | ICD-10-CM

## 2018-10-30 DIAGNOSIS — Z0000 Encounter for general adult medical examination without abnormal findings: Secondary | ICD-10-CM | POA: Diagnosis not present

## 2019-11-17 ENCOUNTER — Other Ambulatory Visit: Payer: Self-pay | Admitting: Family Medicine

## 2019-11-17 DIAGNOSIS — Z0000 Encounter for general adult medical examination without abnormal findings: Secondary | ICD-10-CM

## 2019-11-17 DIAGNOSIS — Z131 Encounter for screening for diabetes mellitus: Secondary | ICD-10-CM

## 2019-11-19 ENCOUNTER — Other Ambulatory Visit (INDEPENDENT_AMBULATORY_CARE_PROVIDER_SITE_OTHER): Payer: No Typology Code available for payment source

## 2019-11-19 DIAGNOSIS — Z0000 Encounter for general adult medical examination without abnormal findings: Secondary | ICD-10-CM | POA: Diagnosis not present

## 2019-11-19 DIAGNOSIS — Z131 Encounter for screening for diabetes mellitus: Secondary | ICD-10-CM

## 2019-11-19 LAB — BASIC METABOLIC PANEL
BUN: 12 mg/dL (ref 6–23)
CO2: 28 mEq/L (ref 19–32)
Calcium: 9.1 mg/dL (ref 8.4–10.5)
Chloride: 105 mEq/L (ref 96–112)
Creatinine, Ser: 0.92 mg/dL (ref 0.40–1.50)
GFR: 84.43 mL/min (ref >60.00)
Glucose, Bld: 96 mg/dL (ref 70–99)
Potassium: 4 mEq/L (ref 3.5–5.1)
Sodium: 138 mEq/L (ref 135–145)

## 2019-11-19 LAB — CBC WITH DIFFERENTIAL/PLATELET
Basophils Absolute: 0.1 10*3/uL (ref 0.0–0.1)
Basophils Relative: 1 % (ref 0.0–3.0)
Eosinophils Absolute: 0.2 10*3/uL (ref 0.0–0.7)
Eosinophils Relative: 3.5 % (ref 0.0–5.0)
HCT: 44.4 % (ref 39.0–52.0)
Hemoglobin: 15 g/dL (ref 13.0–17.0)
Lymphocytes Relative: 27.1 % (ref 12.0–46.0)
Lymphs Abs: 1.6 10*3/uL (ref 0.7–4.0)
MCHC: 33.7 g/dL (ref 30.0–36.0)
MCV: 91.1 fl (ref 78.0–100.0)
Monocytes Absolute: 0.5 10*3/uL (ref 0.1–1.0)
Monocytes Relative: 8.2 % (ref 3.0–12.0)
Neutro Abs: 3.5 10*3/uL (ref 1.4–7.7)
Neutrophils Relative %: 60.2 % (ref 43.0–77.0)
Platelets: 210 10*3/uL (ref 150.0–400.0)
RBC: 4.88 Mil/uL (ref 4.22–5.81)
RDW: 12.7 % (ref 11.5–15.5)
WBC: 5.9 10*3/uL (ref 4.0–10.5)

## 2019-11-19 LAB — HEPATIC FUNCTION PANEL
ALT: 21 U/L (ref 0–53)
AST: 15 U/L (ref 0–37)
Albumin: 4.2 g/dL (ref 3.5–5.2)
Alkaline Phosphatase: 54 U/L (ref 39–117)
Bilirubin, Direct: 0.1 mg/dL (ref 0.0–0.3)
Total Bilirubin: 0.4 mg/dL (ref 0.2–1.2)
Total Protein: 6.7 g/dL (ref 6.0–8.3)

## 2019-11-19 LAB — LIPID PANEL
Cholesterol: 172 mg/dL (ref 0–200)
HDL: 54.1 mg/dL (ref >39.00)
LDL Cholesterol: 94 mg/dL (ref 0–99)
NonHDL: 118.25
Total CHOL/HDL Ratio: 3
Triglycerides: 119 mg/dL (ref 0.0–149.0)
VLDL: 23.8 mg/dL (ref 0.0–40.0)

## 2019-11-19 LAB — HEMOGLOBIN A1C: Hgb A1c MFr Bld: 5.7 % (ref 4.6–6.5)

## 2019-11-20 LAB — REFLEX PSA, FREE
PSA, % Free: 7 % (calc) — ABNORMAL LOW (ref >25)
PSA, Free: 0.5 ng/mL

## 2019-11-20 LAB — PSA, TOTAL WITH REFLEX TO PSA, FREE: PSA, Total: 6.7 ng/mL — ABNORMAL HIGH (ref <=4.0)

## 2019-12-02 ENCOUNTER — Other Ambulatory Visit: Payer: Self-pay

## 2019-12-02 ENCOUNTER — Encounter: Payer: Self-pay | Admitting: Family Medicine

## 2019-12-02 ENCOUNTER — Ambulatory Visit (INDEPENDENT_AMBULATORY_CARE_PROVIDER_SITE_OTHER): Payer: No Typology Code available for payment source | Admitting: Family Medicine

## 2019-12-02 VITALS — BP 120/78 | HR 80 | Temp 98.2°F | Ht 69.25 in | Wt 229.8 lb

## 2019-12-02 DIAGNOSIS — Z23 Encounter for immunization: Secondary | ICD-10-CM | POA: Diagnosis not present

## 2019-12-02 DIAGNOSIS — Z0000 Encounter for general adult medical examination without abnormal findings: Secondary | ICD-10-CM | POA: Diagnosis not present

## 2019-12-02 NOTE — Progress Notes (Signed)
Jason Blower T. [name], MD Primary Care and [place] at [facility] [place], [address] Phone: [phone]  FAX: [phone]  [name] - 59 y.o. male  MRN [id number]  Date of Birth: 24-Aug-1961  Visit Date: 12/02/2019  PCP: Owens Loffler, MD  Referred by: Owens Loffler, MD  Chief Complaint  Patient presents with  . Annual Exam    This visit occurred during the SARS-CoV-2 public health emergency.  Safety protocols were in place, including screening questions prior to the visit, additional usage of staff PPE, and extensive cleaning of exam room while observing appropriate contact time as indicated for disinfecting solutions.   Patient Care Team: Owens Loffler, MD as PCP - General Subjective:   Jason Coffey is a 59 y.o. pleasant patient who presents with the following:  Preventative Health Maintenance Visit:  Health Maintenance Summary Reviewed and updated, unless pt declines services.  Tobacco History Reviewed. Alcohol: about a couple of beers, 2-3 maybe a day. Exercise Habits: Some activity, rec at least 30 mins 5 times a week - walking more now STD concerns: no risk or activity to increase risk Drug Use: None Encouraged self-testicular check  R knee some ngoing pain.  Can feel it about a week after playing tennis.  Before this in the spring and last year he was able to play tennis without much difficulty.  He is able to walk now and he is also doing some stretching and back rehab at least 15 minutes for 3 or 4 days a week.  He is also started working out with some weights again.  Elevated PSA and abnormal free PSA.  He saw urology in the past and had a prostate biopsy in 2019.  At that point they recommended 74-month follow-up.  Health Maintenance  Topic Date Due  . COLONOSCOPY  03/11/2022  . TETANUS/TDAP  11/01/2025  . INFLUENZA VACCINE  Completed  . Hepatitis C Screening  Completed  . HIV  Screening  Completed   Immunization History  Administered Date(s) Administered  . Influenza,inj,Quad PF,6+ Mos 10/25/2016, 09/21/2017, 08/17/2018, 12/02/2019  . Td 10/29/2004  . Tdap 11/02/2015   Patient Active Problem List   Diagnosis Date Noted  . Erectile dysfunction 04/27/2011  . OTHER MALAISE AND FATIGUE 01/09/2010  . ALLERGIC RHINITIS 12/08/2008    Past Medical History:  Diagnosis Date  . Allergic rhinitis    hay fever  . Colon polyp   . GERD (gastroesophageal reflux disease)    every 4 months or so  . Lactose intolerance   . Obesity     Past Surgical History:  Procedure Laterality Date  . [place]  . LUMBAR DISC SURGERY  2006   L4-5  . MTP surgery  1998   1st, right, toe surgery  . TOOTH EXTRACTION     as child    Past Medical History, Surgical History, Social History, Family History, Problem List, Medications, and Allergies have been reviewed and updated if relevant.  Review of Systems: Pertinent positives are listed above.  Otherwise, a full 14 point review of systems has been done in full and it is negative except where it is noted positive.  Objective:   BP 120/78   Pulse 80   Temp 98.2 F (36.8 C) (Temporal)   Ht 5' 9.25" (1.759 m)   Wt 229 lb 12 oz (104.2 kg)   SpO2 96%   BMI 33.68 kg/m  Ideal Body Weight: Weight in (  lb) to have BMI = 25: 170.2  Ideal Body Weight: Weight in (lb) to have BMI = 25: 170.2 No exam data present Depression screen [facility] 2/9 12/02/2019 10/30/2018 10/23/2017  Decreased Interest 0 0 0  Down, Depressed, Hopeless 0 0 0  PHQ - 2 Score 0 0 0     GEN: well developed, well nourished, no acute distress Eyes: conjunctiva and lids normal, PERRLA, EOMI ENT: TM clear, nares clear, oral exam WNL Neck: supple, no lymphadenopathy, no thyromegaly, no JVD Pulm: clear to auscultation and percussion, respiratory effort normal CV: regular rate and rhythm, S1-S2, no murmur, rub or gallop, no bruits, peripheral pulses  normal and symmetric, no cyanosis, clubbing, edema or varicosities GI: soft, non-tender; no hepatosplenomegaly, masses; active bowel sounds all quadrants GU: no hernia, testicular mass, penile discharge Lymph: no cervical, axillary or inguinal adenopathy MSK: gait normal, muscle tone and strength WNL, no joint swelling, effusions, discoloration, crepitus  SKIN: clear, good turgor, color WNL, no rashes, lesions, or ulcerations Neuro: normal mental status, normal strength, sensation, and motion Psych: alert; oriented to person, place and time, normally interactive and not anxious or depressed in appearance. All labs reviewed with patient. Results for orders placed or performed in visit on 11/19/19  PSA, Total with Reflex to PSA, Free  Result Value Ref Range   PSA, Total 6.7 (H) < OR = 4.0 ng/mL  Hemoglobin A1c  Result Value Ref Range   Hgb A1c MFr Bld 5.7 4.6 - 6.5 %  Basic metabolic panel  Result Value Ref Range   Sodium 138 135 - 145 mEq/L   Potassium 4.0 3.5 - 5.1 mEq/L   Chloride 105 96 - 112 mEq/L   CO2 28 19 - 32 mEq/L   Glucose, Bld 96 70 - 99 mg/dL   BUN 12 6 - 23 mg/dL   Creatinine, Ser 0.92 0.40 - 1.50 mg/dL   GFR 84.43 >60.00 mL/min   Calcium 9.1 8.4 - 10.5 mg/dL  CBC with Differential/Platelet  Result Value Ref Range   WBC 5.9 4.0 - 10.5 K/uL   RBC 4.88 4.22 - 5.81 Mil/uL   Hemoglobin 15.0 13.0 - 17.0 g/dL   HCT 44.4 39.0 - 52.0 %   MCV 91.1 78.0 - 100.0 fl   MCHC 33.7 30.0 - 36.0 g/dL   RDW 12.7 11.5 - 15.5 %   Platelets 210.0 150.0 - 400.0 K/uL   Neutrophils Relative % 60.2 43.0 - 77.0 %   Lymphocytes Relative 27.1 12.0 - 46.0 %   Monocytes Relative 8.2 3.0 - 12.0 %   Eosinophils Relative 3.5 0.0 - 5.0 %   Basophils Relative 1.0 0.0 - 3.0 %   Neutro Abs 3.5 1.4 - 7.7 K/uL   Lymphs Abs 1.6 0.7 - 4.0 K/uL   Monocytes Absolute 0.5 0.1 - 1.0 K/uL   Eosinophils Absolute 0.2 0.0 - 0.7 K/uL   Basophils Absolute 0.1 0.0 - 0.1 K/uL  Hepatic function panel  Result  Value Ref Range   Total Bilirubin 0.4 0.2 - 1.2 mg/dL   Bilirubin, Direct 0.1 0.0 - 0.3 mg/dL   Alkaline Phosphatase 54 39 - 117 U/L   AST 15 0 - 37 U/L   ALT 21 0 - 53 U/L   Total Protein 6.7 6.0 - 8.3 g/dL   Albumin 4.2 3.5 - 5.2 g/dL  Lipid panel  Result Value Ref Range   Cholesterol 172 0 - 200 mg/dL   Triglycerides 119.0 0.0 - 149.0 mg/dL   HDL 54.10 >39.00  mg/dL   VLDL 23.8 0.0 - 40.0 mg/dL   LDL Cholesterol 94 0 - 99 mg/dL   Total CHOL/HDL Ratio 3    NonHDL 118.25   reflex PSA, Free  Result Value Ref Range   PSA, Free 0.5 ng/mL   PSA, % Free 7 (L) >25 % (calc)    Assessment and Plan:     ICD-10-CM   1. Healthcare maintenance  Z00.00   2. Need for influenza vaccination  Z23 Flu Vaccine QUAD 6+ mos PF IM (Fluarix Quad PF)   Globally doing well.  With his PSA and free PSA, he is going to follow-up with urology based on his risk profile.  He did have a prior prostate biopsy.  Health Maintenance Exam: The patient's preventative maintenance and recommended screening tests for an annual wellness exam were reviewed in full today. Brought up to date unless services declined.  Counselled on the importance of diet, exercise, and its role in overall health and mortality. The patient's FH and SH was reviewed, including their home life, tobacco status, and drug and alcohol status.  Follow-up in 1 year for physical exam or additional follow-up below.  Follow-up: No follow-ups on file. Or follow-up in 1 year if not noted.  No orders of the defined types were placed in this encounter.  There are no discontinued medications. Orders Placed This Encounter  Procedures  . Flu Vaccine QUAD 6+ mos PF IM (Fluarix Quad PF)    Signed,  Meghen Akopyan T. Anthony Roland, MD   Allergies as of 12/02/2019      Reactions   Tape Rash, Other (See Comments)   Blisters and bleeding      Medication List       Accurate as of December 02, 2019  9:54 AM. If you have any questions, ask your nurse or  doctor.        fexofenadine 180 MG tablet Commonly known as: ALLEGRA Take 180 mg by mouth daily as needed.   fluticasone 50 MCG/ACT nasal spray Commonly known as: FLONASE Place 1 spray into both nostrils daily.   multivitamin tablet Take 1 tablet by mouth daily.

## 2020-01-15 ENCOUNTER — Ambulatory Visit: Payer: 59 | Admitting: Urology

## 2020-02-03 ENCOUNTER — Other Ambulatory Visit: Payer: Self-pay

## 2020-02-03 ENCOUNTER — Encounter: Payer: Self-pay | Admitting: Urology

## 2020-02-03 ENCOUNTER — Ambulatory Visit (INDEPENDENT_AMBULATORY_CARE_PROVIDER_SITE_OTHER): Payer: No Typology Code available for payment source | Admitting: Urology

## 2020-02-03 VITALS — BP 145/85 | HR 81 | Ht 70.0 in | Wt 227.0 lb

## 2020-02-03 DIAGNOSIS — R972 Elevated prostate specific antigen [PSA]: Secondary | ICD-10-CM | POA: Diagnosis not present

## 2020-02-03 NOTE — Progress Notes (Signed)
02/03/2020 8:21 AM   Jason Coffey 05-31-1961 [id number]  Referring provider: Owens Loffler, MD [address] [place],  [place] [address]  Chief Complaint  Patient presents with  . Elevated PSA    HPI: 59 y.o. male presents for follow-up of an elevated PSA  -Prostate biopsy June 2019 PSA 5.58 (4K 24%) -50 g prostate; benign pathology -PSA December 2019 8.0 states follow-up recommended by his PCP but due to Covid elected to hold off -Repeat PSA January 2021 6.7 -No bothersome lower urinary tract symptoms  PSA trend:  9/14     2.46 10/15   2.67  12/16   3.4 12/17   4.6 2/18     3.3 12/18   5.58 2/19     4.5 5/19     4.18 (4K 24%) 12/19   8.0 1/21     6.7  PMH: Past Medical History:  Diagnosis Date  . Allergic rhinitis    hay fever  . Colon polyp   . GERD (gastroesophageal reflux disease)    every 4 months or so  . Lactose intolerance   . Obesity     Surgical History: Past Surgical History:  Procedure Laterality Date  . [place]  . LUMBAR DISC SURGERY  2006   L4-5  . MTP surgery  1998   1st, right, toe surgery  . TOOTH EXTRACTION     as child    Home Medications:  Allergies as of 02/03/2020      Reactions   Tape Rash, Other (See Comments)   Blisters and bleeding      Medication List       Accurate as of February 03, 2020  8:21 AM. If you have any questions, ask your nurse or doctor.        fexofenadine 180 MG tablet Commonly known as: ALLEGRA Take 180 mg by mouth daily as needed.   fluticasone 50 MCG/ACT nasal spray Commonly known as: FLONASE Place 1 spray into both nostrils daily.   multivitamin tablet Take 1 tablet by mouth daily.       Allergies:  Allergies  Allergen Reactions  . Tape Rash and Other (See Comments)    Blisters and bleeding    Family History: Family History  Problem Relation Age of Onset  . Stroke Paternal Grandfather   . Colon cancer Neg Hx     Social History:  reports that  he quit smoking about 32 years ago. He has never used smokeless tobacco. He reports current alcohol use of about 14.0 standard drinks of alcohol per week. He reports that he does not use drugs.   Physical Exam: BP (!) 145/85   Pulse 81   Ht 5\' 10"  (1.778 m)   Wt 227 lb (103 kg)   BMI 32.57 kg/m   Constitutional:  Alert and oriented, No acute distress. HEENT: [place] AT, moist mucus membranes.  Trachea midline, no masses. Cardiovascular: No clubbing, cyanosis, or edema. Respiratory: Normal respiratory effort, no increased work of breathing. GU: Prostate 50 g, smooth without nodules Skin: No rashes, bruises or suspicious lesions. Neurologic: Grossly intact, no focal deficits, moving all 4 extremities. Psychiatric: Normal mood and affect.    Assessment & Plan:    - Elevated PSA Benign biopsy 2019 PSA 5.58.  Most recent PSAs have been elevated above baseline.  Discussed prostate MRI versus continued surveillance.  I would recommend proceeding with prostate MRI and he was in agreement.  He will be notified with  results and further recommendations.   Abbie Sons, [facility] [address], [place], [place] [address] [phone]

## 2020-02-23 ENCOUNTER — Other Ambulatory Visit: Payer: Self-pay | Admitting: Urology

## 2020-02-23 DIAGNOSIS — R972 Elevated prostate specific antigen [PSA]: Secondary | ICD-10-CM

## 2020-03-10 ENCOUNTER — Ambulatory Visit: Payer: No Typology Code available for payment source

## 2020-03-25 ENCOUNTER — Other Ambulatory Visit: Payer: Self-pay | Admitting: Urology

## 2020-03-29 ENCOUNTER — Other Ambulatory Visit: Payer: Self-pay

## 2020-03-29 ENCOUNTER — Ambulatory Visit
Admission: RE | Admit: 2020-03-29 | Discharge: 2020-03-29 | Disposition: A | Discharge status: Home / Self Care | Payer: No Typology Code available for payment source | Source: Ambulatory Visit | Attending: Urology | Admitting: Urology

## 2020-03-29 DIAGNOSIS — R972 Elevated prostate specific antigen [PSA]: Secondary | ICD-10-CM

## 2020-03-29 MED ORDER — GADOBENATE DIMEGLUMINE 529 MG/ML IV SOLN
20.0000 mL | Freq: Once | INTRAVENOUS | Status: AC | PRN
  Administered 2020-03-29: 20 mL via INTRAVENOUS

## 2020-04-01 ENCOUNTER — Telehealth: Payer: Self-pay | Admitting: *Deleted

## 2020-04-01 NOTE — Telephone Encounter (Signed)
Notified patient as instructed, patient pleased. Discussed follow-up appointments, patient agrees  

## 2020-04-01 NOTE — Telephone Encounter (Signed)
-----   Message from Abbie Sons, MD sent at 04/01/2020  9:11 AM EDT ----- Please let patient know his prostate MRI showed no abnormality suspicious for high-grade prostate cancer.  Recommend follow-up visit with PSA/DRE January 2022

## 2020-04-21 ENCOUNTER — Ambulatory Visit (INDEPENDENT_AMBULATORY_CARE_PROVIDER_SITE_OTHER): Payer: No Typology Code available for payment source | Admitting: Family Medicine

## 2020-04-21 ENCOUNTER — Other Ambulatory Visit: Payer: Self-pay

## 2020-04-21 ENCOUNTER — Ambulatory Visit (INDEPENDENT_AMBULATORY_CARE_PROVIDER_SITE_OTHER)
Admission: RE | Admit: 2020-04-21 | Discharge: 2020-04-21 | Disposition: A | Discharge status: Home / Self Care | Payer: No Typology Code available for payment source | Source: Ambulatory Visit | Attending: Family Medicine | Admitting: Family Medicine

## 2020-04-21 ENCOUNTER — Encounter: Payer: Self-pay | Admitting: Family Medicine

## 2020-04-21 VITALS — BP 110/70 | HR 75 | Temp 98.0°F | Ht 69.25 in | Wt 230.5 lb

## 2020-04-21 DIAGNOSIS — M25561 Pain in right knee: Secondary | ICD-10-CM

## 2020-04-21 DIAGNOSIS — M1711 Unilateral primary osteoarthritis, right knee: Secondary | ICD-10-CM

## 2020-04-21 MED ORDER — METHYLPREDNISOLONE ACETATE 40 MG/ML IJ SUSP
80.0000 mg | Freq: Once | INTRAMUSCULAR | Status: AC
  Administered 2020-04-21: 80 mg via INTRA_ARTICULAR

## 2020-04-21 NOTE — Progress Notes (Signed)
Jason Vancuren T. Boy Delamater, MD, [place] at [facility] [place], [address]  Phone: [phone]  FAX: [phone]  Jason Coffey - 59 y.o. male  MRN [id number]  Date of Birth: 07-Feb-1961  Date: 04/21/2020  PCP: Owens Loffler, MD  Referral: Owens Loffler, MD  Chief Complaint  Patient presents with  . Knee Pain    Right    This visit occurred during the SARS-CoV-2 public health emergency.  Safety protocols were in place, including screening questions prior to the visit, additional usage of staff PPE, and extensive cleaning of exam room while observing appropriate contact time as indicated for disinfecting solutions.   Subjective:   Jason Coffey is a 59 y.o. very pleasant male patient with Body mass index is 33.79 kg/m. who presents with the following:  Jason Coffey is a very nice-year-old 58 year old gentleman who presents with some knee pain since January.  He did not have any specific injury that he can recall.  He is an avid Firefighter.  Now he is having pain with rotational movements of the right knee.  He is having pain with getting in and out of a car.  He also has a knee brace with a hinged that he wears when he is playing tennis, and this does help.  He is not had any sort of mechanical symptoms or symptomatic giving way.  No prior significant history including no fractures, dislocation, or prior operative intervention.  He has tried some ibuprofen and this provides some pain relief for a short time.  R knee, since jan.  Any kind of rotation.  Fine with tennis.  Out of the car will hurt.  Motrin, has not tried anything.    No pain now.   Review of Systems is noted in the HPI, as appropriate   Objective:   BP 110/70   Pulse 75   Temp 98 F (36.7 C) (Temporal)   Ht 5' 9.25" (1.759 m)   Wt 230 lb 8 oz (104.6 kg)   SpO2 97%   BMI 33.79 kg/m    GEN: No acute distress;  alert,appropriate. PULM: Breathing comfortably in no respiratory distress PSYCH: Normally interactive.    Right knee: Full extension and flexion to 120 degrees.  Mild effusion.  Stable to varus and valgus stress.  Lachman is negative as well as posterior drawer.  Mild pain with McMurray's and no mechanical symptoms.  Bounce home is negative.  Flexion pinch is also negative.  Radiology: The radiological images were independently reviewed by myself in the office and results were reviewed with the patient. My independent interpretation of images: On the weightbearing knee series, AP, Rosenberg, lateral, sunrise view.  The patient has some mild degenerative joint disease seen the best on the Morton view.  Assessment and Plan:     ICD-10-CM   1. Acute pain of right knee  M25.561 DG Knee 4 Views W/Patella Right    methylPREDNISolone acetate (DEPO-MEDROL) injection 80 mg  2. Primary osteoarthritis of right knee  M17.11    67-month history of knee pain.  Rotational movements hurt, but not all of the time.  Combination of some medial joint line and osteoarthritis with some degenerative change in the cartilage likely.  Reviewed rehab with him from [place] [facility].  Aspiration/Injection Procedure Note Jason Coffey 1961-04-14 Date of procedure: 04/21/2020  Procedure: Large Joint Aspiration / Injection of Knee, R Indications: Pain  Procedure Details Patient  verbally consented to procedure. Risks (including potential rare risk of infection), benefits, and alternatives explained. Sterilely prepped with Chloraprep. Ethyl cholride used for anesthesia. 8 cc Lidocaine 1% mixed with 2 mL Depo-Medrol 40 mg injected using the anteromedial approach without difficulty. No complications with procedure and tolerated well. Patient had decreased pain post-injection. Medication: 2 mL of Depo-Medrol 40 mg, equaling Depo-Medrol 80 mg total   Follow-up: No follow-ups on file.  Meds ordered this  encounter  Medications  . methylPREDNISolone acetate (DEPO-MEDROL) injection 80 mg   Medications Discontinued During This Encounter  Medication Reason  . fluticasone (FLONASE) 50 MCG/ACT nasal spray Completed Course   Orders Placed This Encounter  Procedures  . DG Knee 4 Views W/Patella Right    Signed,  Frederico Hamman T. Carlosdaniel Grob, MD   Outpatient Encounter Medications as of 04/21/2020  Medication Sig  . fexofenadine (ALLEGRA) 180 MG tablet Take 180 mg by mouth daily as needed.  . Multiple Vitamin (MULTIVITAMIN) tablet Take 1 tablet by mouth daily.    . [DISCONTINUED] fluticasone (FLONASE) 50 MCG/ACT nasal spray Place 1 spray into both nostrils daily.   Facility-Administered Encounter Medications as of 04/21/2020  Medication  . 0.9 %  sodium chloride infusion  . [COMPLETED] methylPREDNISolone acetate (DEPO-MEDROL) injection 80 mg

## 2020-08-10 ENCOUNTER — Ambulatory Visit (INDEPENDENT_AMBULATORY_CARE_PROVIDER_SITE_OTHER): Payer: No Typology Code available for payment source

## 2020-08-10 ENCOUNTER — Other Ambulatory Visit: Payer: Self-pay

## 2020-08-10 DIAGNOSIS — Z23 Encounter for immunization: Secondary | ICD-10-CM | POA: Diagnosis not present

## 2020-10-31 ENCOUNTER — Other Ambulatory Visit: Payer: Self-pay

## 2020-11-04 ENCOUNTER — Other Ambulatory Visit: Payer: Self-pay

## 2020-11-04 DIAGNOSIS — R972 Elevated prostate specific antigen [PSA]: Secondary | ICD-10-CM

## 2020-11-07 ENCOUNTER — Other Ambulatory Visit: Payer: 59

## 2020-11-07 ENCOUNTER — Other Ambulatory Visit: Payer: Self-pay

## 2020-11-07 DIAGNOSIS — R972 Elevated prostate specific antigen [PSA]: Secondary | ICD-10-CM

## 2020-11-08 LAB — PSA: Prostate Specific Ag, Serum: 6.8 ng/mL — ABNORMAL HIGH (ref 0.0–4.0)

## 2020-11-09 ENCOUNTER — Ambulatory Visit: Payer: Self-pay | Admitting: Urology

## 2020-11-16 ENCOUNTER — Other Ambulatory Visit: Payer: Self-pay

## 2020-11-16 ENCOUNTER — Encounter: Payer: Self-pay | Admitting: Urology

## 2020-11-16 ENCOUNTER — Ambulatory Visit (INDEPENDENT_AMBULATORY_CARE_PROVIDER_SITE_OTHER): Payer: 59 | Admitting: Urology

## 2020-11-16 VITALS — BP 156/90 | HR 80 | Ht 70.0 in | Wt 225.0 lb

## 2020-11-16 DIAGNOSIS — R972 Elevated prostate specific antigen [PSA]: Secondary | ICD-10-CM

## 2020-11-16 NOTE — Progress Notes (Signed)
11/16/2020 9:57 AM   Jason Coffey January 04, 1961 [id number]  Referring provider: Owens Loffler, MD [address] [place],  [place] [address]  Chief Complaint  Patient presents with  . Elevated PSA    Urologic history: 1. elevated PSA -Biopsy 03/2018; PSA 5.58 (4K 24%); 50 g prostate; benign pathology -Prostate MRI in 03/29/2020; 30 cc gland; no suspicious lesions, PI-RADS 1   HPI: 60 y.o. male presents for follow-up.   No bothersome LUTS  Denies dysuria, gross hematuria  Denies flank, abdominal or pelvic pain  PSA 11/07/2020 stable at 6.8  /14 2.46 10/15 2.67  12/16 3.4 12/17 4.6 2/18 3.3 12/18 5.58 2/19 4.5 5/19     4.18 (4K 24%) 12/19   8.0 1/21     6.7 1/22     6.8   PMH: Past Medical History:  Diagnosis Date  . Allergic rhinitis    hay fever  . Colon polyp   . GERD (gastroesophageal reflux disease)    every 4 months or so  . Lactose intolerance   . Obesity     Surgical History: Past Surgical History:  Procedure Laterality Date  . [place]  . LUMBAR DISC SURGERY  2006   L4-5  . MTP surgery  1998   1st, right, toe surgery  . TOOTH EXTRACTION     as child    Home Medications:  Allergies as of 11/16/2020      Reactions   Tape Rash, Other (See Comments)   Blisters and bleeding      Medication List       Accurate as of November 16, 2020  9:57 AM. If you have any questions, ask your nurse or doctor.        fexofenadine 180 MG tablet Commonly known as: ALLEGRA Take 180 mg by mouth daily as needed.   multivitamin tablet Take 1 tablet by mouth daily.       Allergies:  Allergies  Allergen Reactions  . Tape Rash and Other (See Comments)    Blisters and bleeding    Family History: Family History  Problem Relation Age of Onset  . Stroke Paternal Grandfather   . Colon cancer Neg Hx     Social History:  reports that he quit smoking about 33 years ago. He has never used smokeless  tobacco. He reports current alcohol use of about 14.0 standard drinks of alcohol per week. He reports that he does not use drugs.   Physical Exam: BP (!) 156/90   Pulse 80   Ht 5\' 10"  (1.778 m)   Wt 225 lb (102.1 kg)   BMI 32.28 kg/m   Constitutional:  Alert and oriented, No acute distress. HEENT: [place] AT, moist mucus membranes.  Trachea midline, no masses. Cardiovascular: No clubbing, cyanosis, or edema. Respiratory: Normal respiratory effort, no increased work of breathing. GI: Abdomen is soft, nontender, nondistended, no abdominal masses GU: Prostate 40 g, smooth without nodules Skin: No rashes, bruises or suspicious lesions. Neurologic: Grossly intact, no focal deficits, moving all 4 extremities. Psychiatric: Normal mood and affect.   Assessment & Plan:    1.  Elevated PSA  MRI 03/2020 without suspicious lesions  PSA stable 6.8  The false-negative rate of prostate MRIs were discussed  He would like to continue surveillance; follow-up lab visit 6 months for PSA and 1 year office visit for [place], MD  [place] [address] [place], [place] [address] [phone]  [phone]  

## 2020-11-28 ENCOUNTER — Other Ambulatory Visit: Payer: Self-pay | Admitting: Family Medicine

## 2020-11-28 ENCOUNTER — Other Ambulatory Visit (INDEPENDENT_AMBULATORY_CARE_PROVIDER_SITE_OTHER): Payer: 59

## 2020-11-28 ENCOUNTER — Other Ambulatory Visit: Payer: Self-pay

## 2020-11-28 DIAGNOSIS — Z131 Encounter for screening for diabetes mellitus: Secondary | ICD-10-CM | POA: Diagnosis not present

## 2020-11-28 DIAGNOSIS — Z13220 Encounter for screening for lipoid disorders: Secondary | ICD-10-CM | POA: Diagnosis not present

## 2020-11-28 DIAGNOSIS — R5383 Other fatigue: Secondary | ICD-10-CM | POA: Diagnosis not present

## 2020-11-28 LAB — HEPATIC FUNCTION PANEL
ALT: 20 U/L (ref 0–53)
AST: 15 U/L (ref 0–37)
Albumin: 4.2 g/dL (ref 3.5–5.2)
Alkaline Phosphatase: 58 U/L (ref 39–117)
Bilirubin, Direct: 0.1 mg/dL (ref 0.0–0.3)
Total Bilirubin: 0.6 mg/dL (ref 0.2–1.2)
Total Protein: 6.7 g/dL (ref 6.0–8.3)

## 2020-11-28 LAB — BASIC METABOLIC PANEL
BUN: 13 mg/dL (ref 6–23)
CO2: 27 mEq/L (ref 19–32)
Calcium: 9.2 mg/dL (ref 8.4–10.5)
Chloride: 102 mEq/L (ref 96–112)
Creatinine, Ser: 0.85 mg/dL (ref 0.40–1.50)
GFR: 95.28 mL/min (ref >60.00)
Glucose, Bld: 94 mg/dL (ref 70–99)
Potassium: 4 mEq/L (ref 3.5–5.1)
Sodium: 136 mEq/L (ref 135–145)

## 2020-11-28 LAB — LIPID PANEL
Cholesterol: 182 mg/dL (ref 0–200)
HDL: 62 mg/dL (ref >39.00)
LDL Cholesterol: 103 mg/dL — ABNORMAL HIGH (ref 0–99)
NonHDL: 120.16
Total CHOL/HDL Ratio: 3
Triglycerides: 87 mg/dL (ref 0.0–149.0)
VLDL: 17.4 mg/dL (ref 0.0–40.0)

## 2020-11-28 LAB — CBC WITH DIFFERENTIAL/PLATELET
Basophils Absolute: 0 10*3/uL (ref 0.0–0.1)
Basophils Relative: 0.7 % (ref 0.0–3.0)
Eosinophils Absolute: 0.2 10*3/uL (ref 0.0–0.7)
Eosinophils Relative: 3.1 % (ref 0.0–5.0)
HCT: 42.8 % (ref 39.0–52.0)
Hemoglobin: 14.4 g/dL (ref 13.0–17.0)
Lymphocytes Relative: 24.7 % (ref 12.0–46.0)
Lymphs Abs: 1.4 10*3/uL (ref 0.7–4.0)
MCHC: 33.7 g/dL (ref 30.0–36.0)
MCV: 90.7 fl (ref 78.0–100.0)
Monocytes Absolute: 0.5 10*3/uL (ref 0.1–1.0)
Monocytes Relative: 8.5 % (ref 3.0–12.0)
Neutro Abs: 3.7 10*3/uL (ref 1.4–7.7)
Neutrophils Relative %: 63 % (ref 43.0–77.0)
Platelets: 198 10*3/uL (ref 150.0–400.0)
RBC: 4.72 Mil/uL (ref 4.22–5.81)
RDW: 13 % (ref 11.5–15.5)
WBC: 5.9 10*3/uL (ref 4.0–10.5)

## 2020-11-28 LAB — HEMOGLOBIN A1C: Hgb A1c MFr Bld: 5.8 % (ref 4.6–6.5)

## 2020-12-04 NOTE — Progress Notes (Signed)
Jason Carbonell T. Rider Ermis, MD, [place] at [facility] [place], [address]  Phone: [phone]  FAX: [phone]  Jason Coffey - 60 y.o. male  MRN [id number]  Date of Birth: Jan 25, 1961  Date: 12/05/2020  PCP: Jason Loffler, MD  Referral: Jason Loffler, MD  Chief Complaint  Patient presents with  . Annual Exam    This visit occurred during the SARS-CoV-2 public health emergency.  Safety protocols were in place, including screening questions prior to the visit, additional usage of staff PPE, and extensive cleaning of exam room while observing appropriate contact time as indicated for disinfecting solutions.   Patient Care Team: Jason Loffler, MD as PCP - General Subjective:   Jason Coffey is a 60 y.o. pleasant patient who presents with the following:  Preventative Health Maintenance Visit:  Health Maintenance Summary Reviewed and updated, unless pt declines services.  Tobacco History Reviewed. Alcohol: about 2 each night Exercise Habits: Some activity, rec at least 30 mins 5 times a week - golf and tennis STD concerns: no risk or activity to increase risk Drug Use: None  He is a very nice, generally healthy gentleman who I recall quite well.  He is here today for general health maintenance follow-up.  Goes to see his mom.   Sees some friends out a few times a year.   Health Maintenance  Topic Date Due  . COVID-19 Vaccine (4 - Booster for Pfizer series) 02/23/2021  . COLONOSCOPY (Pts 45-66yrs Insurance coverage will need to be confirmed)  03/11/2022  . TETANUS/TDAP  11/01/2025  . INFLUENZA VACCINE  Completed  . Hepatitis C Screening  Completed  . HIV Screening  Completed   Immunization History  Administered Date(s) Administered  . Influenza,inj,Quad PF,6+ Mos 10/25/2016, 09/21/2017, 08/17/2018, 12/02/2019, 08/10/2020  . PFIZER(Purple Top)SARS-COV-2 Vaccination  01/21/2020, 02/11/2020, 08/25/2020  . Td 10/29/2004  . Tdap 11/02/2015   Patient Active Problem List   Diagnosis Date Noted  . Elevated PSA 02/03/2020  . Erectile dysfunction 04/27/2011  . ALLERGIC RHINITIS 12/08/2008    Past Medical History:  Diagnosis Date  . Allergic rhinitis    hay fever  . Colon polyp   . GERD (gastroesophageal reflux disease)    every 4 months or so  . Lactose intolerance   . Obesity     Past Surgical History:  Procedure Laterality Date  . [place]  . LUMBAR DISC SURGERY  2006   L4-5  . MTP surgery  1998   1st, right, toe surgery  . TOOTH EXTRACTION     as child    Family History  Problem Relation Age of Onset  . Stroke Paternal Grandfather   . Colon cancer Neg Hx     Past Medical History, Surgical History, Social History, Family History, Problem List, Medications, and Allergies have been reviewed and updated if relevant.  Review of Systems: Pertinent positives are listed above.  Otherwise, a full 14 point review of systems has been done in full and it is negative except where it is noted positive.  Objective:   BP 120/80   Pulse 72   Temp 98 F (36.7 C) (Temporal)   Ht 5' 9.5" (1.765 m)   Wt 228 lb 8 oz (103.6 kg)   SpO2 97%   BMI 33.26 kg/m  Ideal Body Weight: Weight in (lb) to have BMI = 25: 171.4  Ideal Body Weight: Weight in (lb) to  have BMI = 25: 171.4 No exam data present Depression screen [facility] 2/9 12/05/2020 12/02/2019 10/30/2018 10/23/2017  Decreased Interest 0 0 0 0  Down, Depressed, Hopeless 0 0 0 0  PHQ - 2 Score 0 0 0 0     GEN: well developed, well nourished, no acute distress Eyes: conjunctiva and lids normal, PERRLA, EOMI ENT: TM clear, nares clear, oral exam WNL Neck: supple, no lymphadenopathy, no thyromegaly, no JVD Pulm: clear to auscultation and percussion, respiratory effort normal CV: regular rate and rhythm, S1-S2, no murmur, rub or gallop, no bruits, peripheral pulses normal and  symmetric, no cyanosis, clubbing, edema or varicosities GI: soft, non-tender; no hepatosplenomegaly, masses; active bowel sounds all quadrants GU: deferred Lymph: no cervical, axillary or inguinal adenopathy MSK: gait normal, muscle tone and strength WNL, no joint swelling, effusions, discoloration, crepitus  SKIN: clear, good turgor, color WNL, no rashes, lesions, or ulcerations Neuro: normal mental status, normal strength, sensation, and motion Psych: alert; oriented to person, place and time, normally interactive and not anxious or depressed in appearance.  All labs reviewed with patient. Results for orders placed or performed in visit on 11/28/20  Hemoglobin A1c  Result Value Ref Range   Hgb A1c MFr Bld 5.8 4.6 - 6.5 %  CBC with Differential/Platelet  Result Value Ref Range   WBC 5.9 4.0 - 10.5 K/uL   RBC 4.72 4.22 - 5.81 Mil/uL   Hemoglobin 14.4 13.0 - 17.0 g/dL   HCT 22.0 25.4 - 27.0 %   MCV 90.7 78.0 - 100.0 fl   MCHC 33.7 30.0 - 36.0 g/dL   RDW 62.3 76.2 - 83.1 %   Platelets 198.0 150.0 - 400.0 K/uL   Neutrophils Relative % 63.0 43.0 - 77.0 %   Lymphocytes Relative 24.7 12.0 - 46.0 %   Monocytes Relative 8.5 3.0 - 12.0 %   Eosinophils Relative 3.1 0.0 - 5.0 %   Basophils Relative 0.7 0.0 - 3.0 %   Neutro Abs 3.7 1.4 - 7.7 K/uL   Lymphs Abs 1.4 0.7 - 4.0 K/uL   Monocytes Absolute 0.5 0.1 - 1.0 K/uL   Eosinophils Absolute 0.2 0.0 - 0.7 K/uL   Basophils Absolute 0.0 0.0 - 0.1 K/uL  Basic metabolic panel  Result Value Ref Range   Sodium 136 135 - 145 mEq/L   Potassium 4.0 3.5 - 5.1 mEq/L   Chloride 102 96 - 112 mEq/L   CO2 27 19 - 32 mEq/L   Glucose, Bld 94 70 - 99 mg/dL   BUN 13 6 - 23 mg/dL   Creatinine, Ser 5.17 0.40 - 1.50 mg/dL   GFR 61.60 >73.71 mL/min   Calcium 9.2 8.4 - 10.5 mg/dL  Hepatic function panel  Result Value Ref Range   Total Bilirubin 0.6 0.2 - 1.2 mg/dL   Bilirubin, Direct 0.1 0.0 - 0.3 mg/dL   Alkaline Phosphatase 58 39 - 117 U/L   AST 15 0 -  37 U/L   ALT 20 0 - 53 U/L   Total Protein 6.7 6.0 - 8.3 g/dL   Albumin 4.2 3.5 - 5.2 g/dL  Lipid panel  Result Value Ref Range   Cholesterol 182 0 - 200 mg/dL   Triglycerides 06.2 0.0 - 149.0 mg/dL   HDL 69.48 >54.62 mg/dL   VLDL 70.3 0.0 - 50.0 mg/dL   LDL Cholesterol 938 (H) 0 - 99 mg/dL   Total CHOL/HDL Ratio 3    NonHDL 120.16     Assessment and Plan:  ICD-10-CM   1. Healthcare maintenance  Z00.00    Emari is doing well.  I don't really have any concerns - but I did encourage him to keep working on his weight.  Health Maintenance Exam: The patient's preventative maintenance and recommended screening tests for an annual wellness exam were reviewed in full today. Brought up to date unless services declined.  Counselled on the importance of diet, exercise, and its role in overall health and mortality. The patient's FH and SH was reviewed, including their home life, tobacco status, and drug and alcohol status.  Follow-up in 1 year for physical exam or additional follow-up below.  Follow-up: No follow-ups on file. Or follow-up in 1 year if not noted.  No orders of the defined types were placed in this encounter.  There are no discontinued medications. No orders of the defined types were placed in this encounter.   Signed,  Maud Deed. Kalecia Hartney, MD   Allergies as of 12/05/2020      Reactions   Tape Rash, Other (See Comments)   Blisters and bleeding      Medication List       Accurate as of December 05, 2020  9:46 AM. If you have any questions, ask your nurse or doctor.        fexofenadine 180 MG tablet Commonly known as: ALLEGRA Take 180 mg by mouth daily as needed.   Fish Oil 1000 MG Caps Take 1 capsule by mouth daily.   meloxicam 15 MG tablet Commonly known as: MOBIC Take 15 mg by mouth daily as needed.   multivitamin tablet Take 1 tablet by mouth daily.

## 2020-12-05 ENCOUNTER — Ambulatory Visit (INDEPENDENT_AMBULATORY_CARE_PROVIDER_SITE_OTHER): Payer: 59 | Admitting: Family Medicine

## 2020-12-05 ENCOUNTER — Encounter: Payer: Self-pay | Admitting: Family Medicine

## 2020-12-05 ENCOUNTER — Other Ambulatory Visit: Payer: Self-pay

## 2020-12-05 VITALS — BP 120/80 | HR 72 | Temp 98.0°F | Ht 69.5 in | Wt 228.5 lb

## 2020-12-05 DIAGNOSIS — Z0000 Encounter for general adult medical examination without abnormal findings: Secondary | ICD-10-CM

## 2020-12-16 ENCOUNTER — Ambulatory Visit: Payer: Self-pay | Admitting: Urology

## 2021-05-09 ENCOUNTER — Other Ambulatory Visit: Payer: Self-pay

## 2021-05-09 ENCOUNTER — Other Ambulatory Visit: Payer: 59

## 2021-05-09 DIAGNOSIS — R972 Elevated prostate specific antigen [PSA]: Secondary | ICD-10-CM

## 2021-05-10 LAB — PSA: Prostate Specific Ag, Serum: 7.1 ng/mL — ABNORMAL HIGH (ref 0.0–4.0)

## 2021-05-11 ENCOUNTER — Encounter: Payer: Self-pay | Admitting: Urology

## 2021-05-16 ENCOUNTER — Other Ambulatory Visit: Payer: Self-pay

## 2021-06-29 IMAGING — DX DG KNEE COMPLETE 4+V*R*
4 series · 4 of 4 positions shown · non-contrast
Comparison: None.

CLINICAL DATA: Right knee pain for several months

EXAM:
RIGHT KNEE - COMPLETE 4+ VIEW

[knee ap]
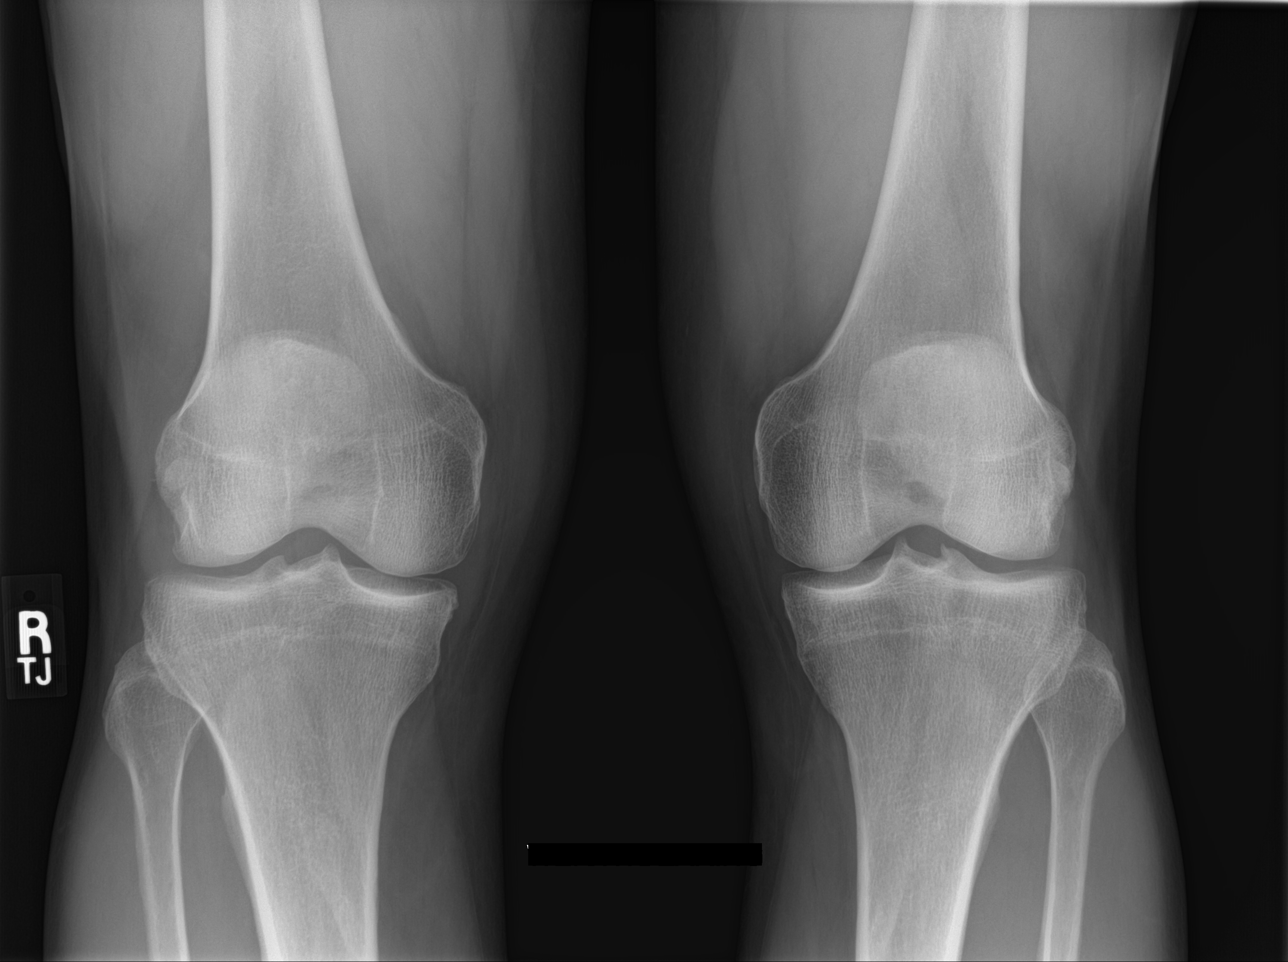

[knee tunnel]
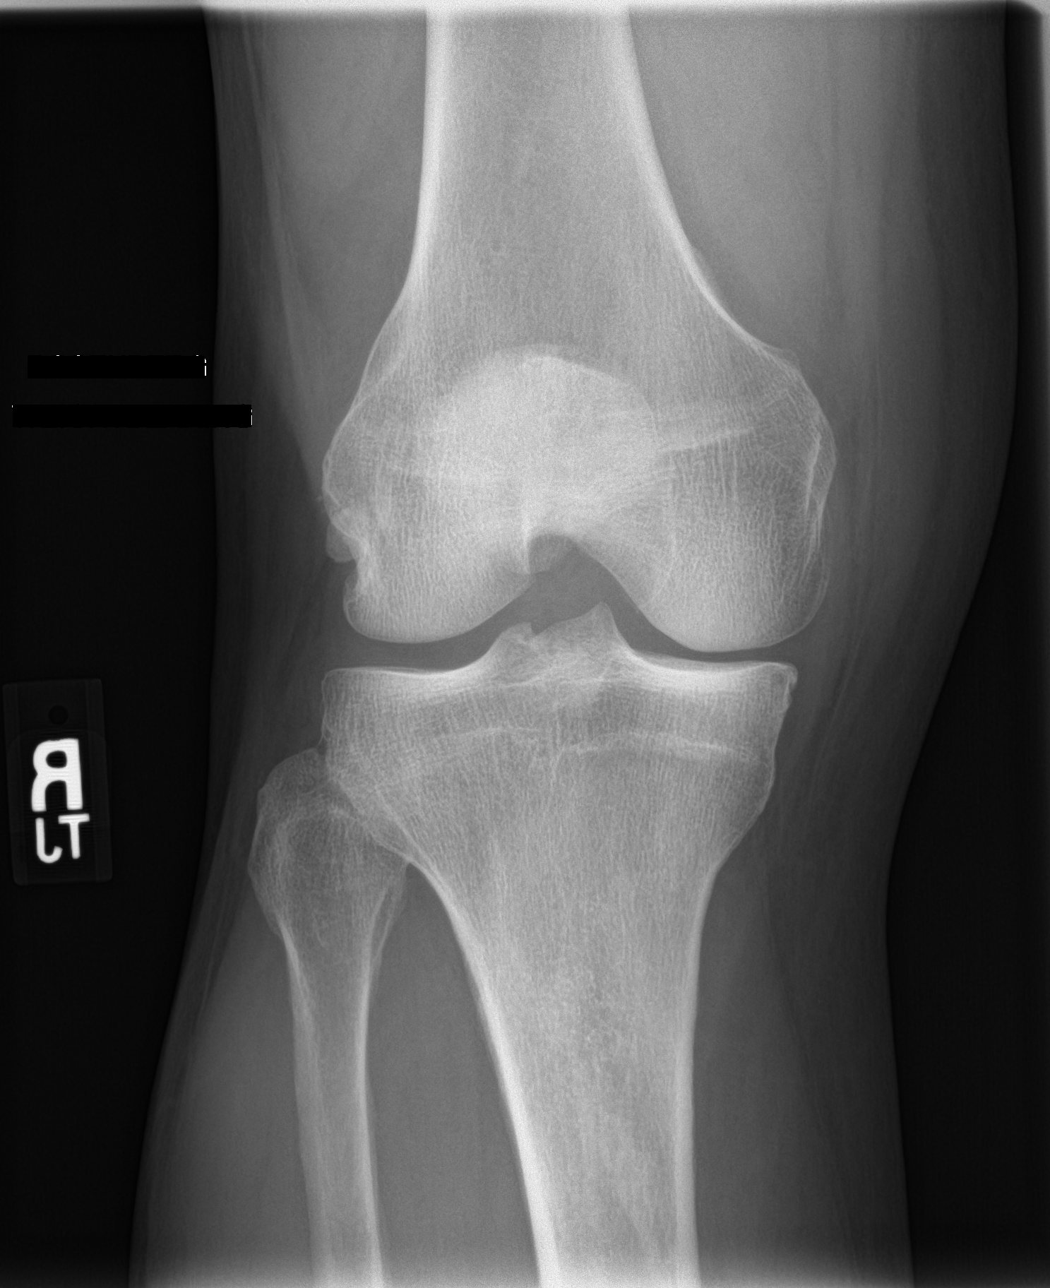

[knee lat]
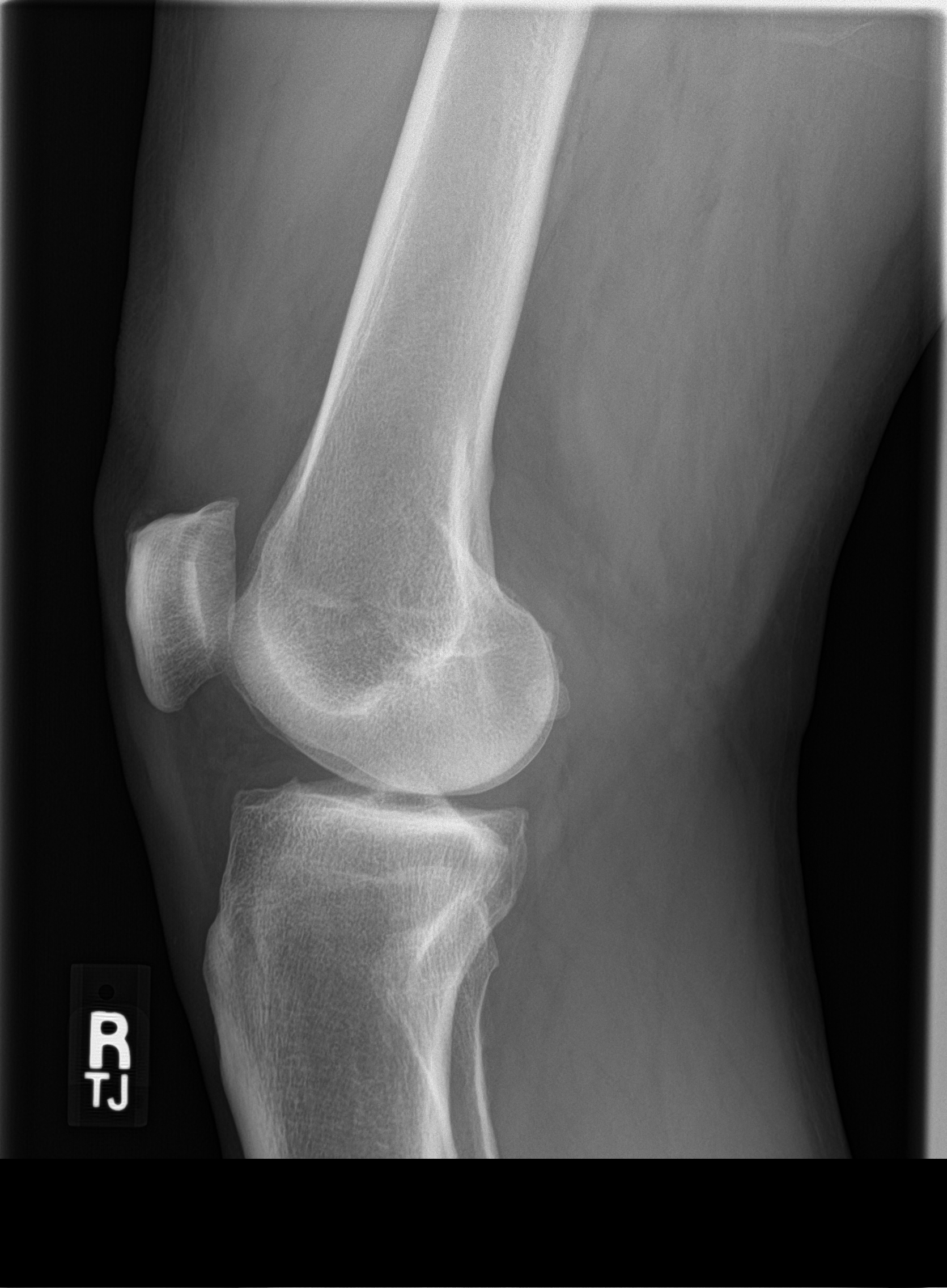

[patella skyline]
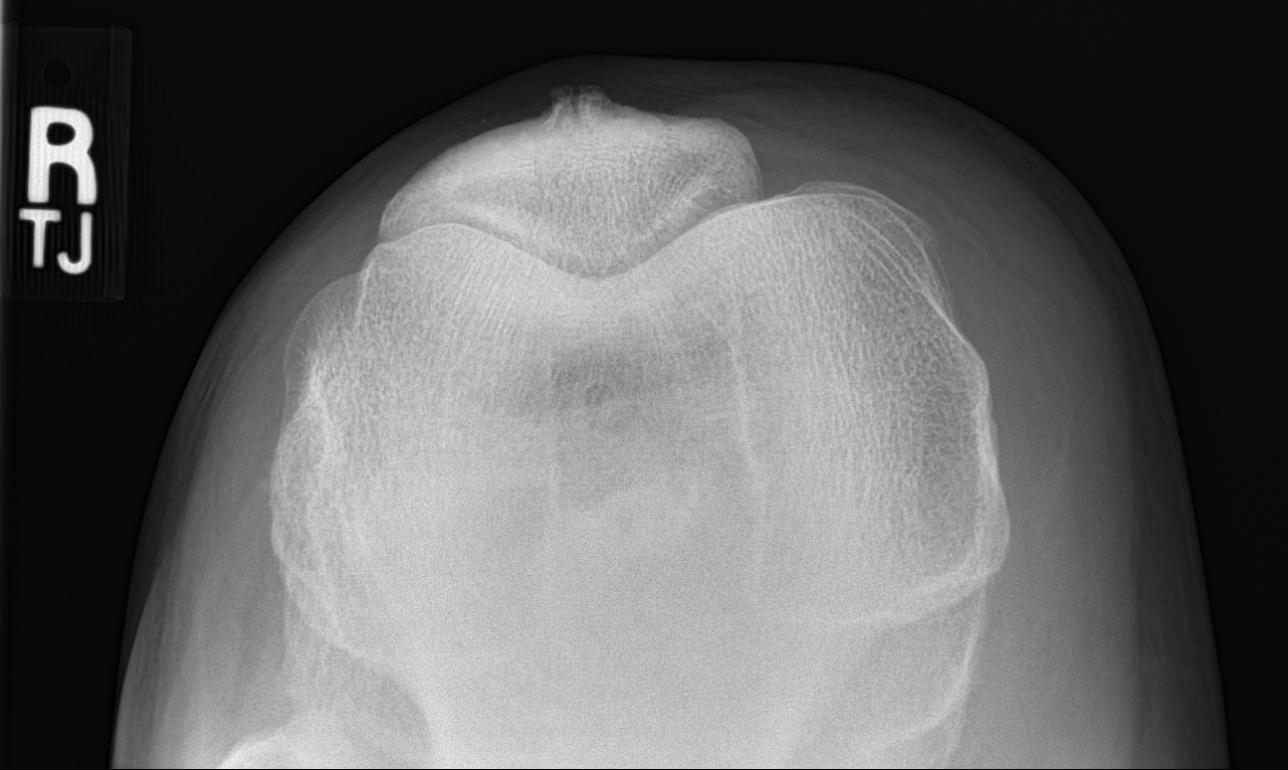

[4 of 4 positions shown; findings below may reference images not displayed]

FINDINGS: No acute fracture or dislocation is noted. Very mild patellofemoral
degenerative changes are noted. Mild medial joint space narrowing is
seen.
IMPRESSION: No acute fracture is noted.  Mild degenerative changes are seen.

## 2021-09-14 ENCOUNTER — Other Ambulatory Visit: Payer: Self-pay

## 2021-09-14 ENCOUNTER — Ambulatory Visit (INDEPENDENT_AMBULATORY_CARE_PROVIDER_SITE_OTHER): Payer: 59 | Admitting: Family Medicine

## 2021-09-14 ENCOUNTER — Encounter: Payer: Self-pay | Admitting: Family Medicine

## 2021-09-14 VITALS — BP 130/80 | HR 65 | Temp 98.1°F | Ht 69.5 in | Wt 234.1 lb

## 2021-09-14 DIAGNOSIS — M5412 Radiculopathy, cervical region: Secondary | ICD-10-CM | POA: Diagnosis not present

## 2021-09-14 DIAGNOSIS — Z23 Encounter for immunization: Secondary | ICD-10-CM

## 2021-09-14 MED ORDER — PREDNISONE 20 MG PO TABS: ORAL_TABLET | ORAL | 0 refills | Status: DC

## 2021-09-14 MED ORDER — CYCLOBENZAPRINE HCL 10 MG PO TABS
10.0000 mg | ORAL_TABLET | Freq: Every evening | ORAL | 1 refills | Status: DC | PRN
Start: 2021-09-14 — End: 2021-12-21

## 2021-09-14 NOTE — Progress Notes (Signed)
Jason Coffey T. Jason Murakami, MD, [place] at [facility] [place], [address]  Phone: [phone]  FAX: [phone]  [name] - 60 y.o. male  MRN [id number]  Date of Birth: 10-18-1961  Date: 09/14/2021  PCP: Owens Loffler, MD  Referral: Owens Loffler, MD  Chief Complaint  Patient presents with   Neck Pain    Radiates down right arm    This visit occurred during the SARS-CoV-2 public health emergency.  Safety protocols were in place, including screening questions prior to the visit, additional usage of staff PPE, and extensive cleaning of exam room while observing appropriate contact time as indicated for disinfecting solutions.   Subjective:   Jason Coffey is a 60 y.o. very pleasant male patient with Body mass index is 34.08 kg/m. who presents with the following:  Acute neck pain:    Over in [place], neck was having some pain.  He is traveling there regularly now to take care of elderly parents. On Monday, really started to have pain down the R arm.  5-6 days.  Some tingling down in his R arm. When he has his head bent over his pain really is not that bad but as he extends even to a normal position he has radicular pain down his right arm. He denies pain in additional upper extremity joints on the right side including hand, wrist, shoulder.  He does have some pain in the elbow without focal bone or tendon tenderness.  Trouble with his toothbrush. Feels numb and swollen in his   Lifting up will give sensation.   20 years ago, had some significant neck pain - thought ddd.  Did some pt with therapy and traction.  He does have a prior history of disc herniation with surgery in the lowest part of the back.  + self-spurlings  Down to the L feels the best.    Review of Systems is noted in the HPI, as appropriate  Objective:   BP 130/80   Pulse 65   Temp 98.1 F (36.7 C) (Temporal)   Ht 5' 9.5"  (1.765 m)   Wt 234 lb 2 oz (106.2 kg)   SpO2 97%   BMI 34.08 kg/m   GEN: No acute distress; alert,appropriate. PULM: Breathing comfortably in no respiratory distress PSYCH: Normally interactive   CERVICAL SPINE EXAM Range of motion: Flexion, extension, lateral bending, and rotation: Minimal ability to extend.  He lacks 20 degrees of forward flexion.  Lateral bending is more approaching normal.  Notable decrease rotational movements. Pain with terminal motion: Yes Spinous Processes: NT SCM: NT Upper paracervical muscles: Yes, relatively mild. Upper traps: NT C5-T1 : Strength is intact throughout. There is sensation of decreased pain and fuzziness on soft touch in the right hand, but normal pinprick.   Self Spurling's maneuver is positive with notable radicular pain.  Entirety of the shoulder and elbow exam is normal including stress of all muscles, tendon and all provocative maneuvers.  Hand and wrist exam is normal without focal remarkable pain.  Deep tendon reflexes are 2+  Laboratory and Imaging Data:  Assessment and Plan:     ICD-10-CM   1. Cervical radiculopathy, acute  M54.12     2. Need for influenza vaccination  Z23 Flu Vaccine QUAD 6+ mos PF IM (Fluarix Quad PF)     Notable fairly dramatic cervical radiculopathy on the right.  Strength is intact, sensation is grossly intact with exception of some subjective mild  decrease sensation on the right hand.  Nonfocal.  For now, gave him McKenzie protocol of the spine to work on as well as some postural and isometrics.  Functional basic impairment.  Meds ordered this encounter  Medications   predniSONE (DELTASONE) 20 MG tablet    Sig: 2 tabs po for 7 days, then 1 tab po for 7 days    Dispense:  21 tablet    Refill:  0   cyclobenzaprine (FLEXERIL) 10 MG tablet    Sig: Take 1 tablet (10 mg total) by mouth at bedtime as needed for muscle spasms.    Dispense:  30 tablet    Refill:  1   There are no discontinued  medications. Orders Placed This Encounter  Procedures   Flu Vaccine QUAD 6+ mos PF IM (Fluarix Quad PF)    Follow-up: Return in about 4 weeks (around 10/12/2021).  Dragon [employer] One speech-to-text software was used for transcription in this dictation.  Possible transcriptional errors can occur using Editor, commissioning.   Signed,  Maud Deed. Yocelyn Brocious, MD   Outpatient Encounter Medications as of 09/14/2021  Medication Sig   cyclobenzaprine (FLEXERIL) 10 MG tablet Take 1 tablet (10 mg total) by mouth at bedtime as needed for muscle spasms.   fexofenadine (ALLEGRA) 180 MG tablet Take 180 mg by mouth daily as needed.   meloxicam (MOBIC) 15 MG tablet Take 15 mg by mouth daily as needed.   Multiple Vitamin (MULTIVITAMIN) tablet Take 1 tablet by mouth daily.   Omega-3 Fatty Acids (FISH OIL) 1000 MG CAPS Take 1 capsule by mouth daily.   predniSONE (DELTASONE) 20 MG tablet 2 tabs po for 7 days, then 1 tab po for 7 days   Facility-Administered Encounter Medications as of 09/14/2021  Medication   0.9 %  sodium chloride infusion

## 2021-09-20 DIAGNOSIS — M5412 Radiculopathy, cervical region: Secondary | ICD-10-CM | POA: Insufficient documentation

## 2021-10-12 ENCOUNTER — Ambulatory Visit: Payer: 59 | Admitting: Family Medicine

## 2021-11-28 ENCOUNTER — Other Ambulatory Visit: Payer: Self-pay | Admitting: Family Medicine

## 2021-11-28 DIAGNOSIS — Z79899 Other long term (current) drug therapy: Secondary | ICD-10-CM

## 2021-11-28 DIAGNOSIS — R972 Elevated prostate specific antigen [PSA]: Secondary | ICD-10-CM

## 2021-11-28 DIAGNOSIS — Z131 Encounter for screening for diabetes mellitus: Secondary | ICD-10-CM

## 2021-11-28 DIAGNOSIS — Z13220 Encounter for screening for lipoid disorders: Secondary | ICD-10-CM

## 2021-11-29 ENCOUNTER — Other Ambulatory Visit: Payer: 59

## 2021-12-06 ENCOUNTER — Encounter: Payer: 59 | Admitting: Family Medicine

## 2021-12-13 ENCOUNTER — Encounter: Payer: 59 | Admitting: Family Medicine

## 2021-12-14 ENCOUNTER — Other Ambulatory Visit: Payer: Self-pay

## 2021-12-14 ENCOUNTER — Other Ambulatory Visit (INDEPENDENT_AMBULATORY_CARE_PROVIDER_SITE_OTHER): Payer: 59

## 2021-12-14 DIAGNOSIS — Z13220 Encounter for screening for lipoid disorders: Secondary | ICD-10-CM

## 2021-12-14 DIAGNOSIS — R972 Elevated prostate specific antigen [PSA]: Secondary | ICD-10-CM | POA: Diagnosis not present

## 2021-12-14 DIAGNOSIS — Z131 Encounter for screening for diabetes mellitus: Secondary | ICD-10-CM | POA: Diagnosis not present

## 2021-12-14 DIAGNOSIS — Z79899 Other long term (current) drug therapy: Secondary | ICD-10-CM

## 2021-12-14 LAB — CBC WITH DIFFERENTIAL/PLATELET
Basophils Absolute: 0.1 10*3/uL (ref 0.0–0.1)
Basophils Relative: 0.9 % (ref 0.0–3.0)
Eosinophils Absolute: 0.2 10*3/uL (ref 0.0–0.7)
Eosinophils Relative: 3.4 % (ref 0.0–5.0)
HCT: 45.1 % (ref 39.0–52.0)
Hemoglobin: 14.9 g/dL (ref 13.0–17.0)
Lymphocytes Relative: 25.9 % (ref 12.0–46.0)
Lymphs Abs: 1.7 10*3/uL (ref 0.7–4.0)
MCHC: 33.1 g/dL (ref 30.0–36.0)
MCV: 90.6 fl (ref 78.0–100.0)
Monocytes Absolute: 0.5 10*3/uL (ref 0.1–1.0)
Monocytes Relative: 7.6 % (ref 3.0–12.0)
Neutro Abs: 4 10*3/uL (ref 1.4–7.7)
Neutrophils Relative %: 62.2 % (ref 43.0–77.0)
Platelets: 183 10*3/uL (ref 150.0–400.0)
RBC: 4.98 Mil/uL (ref 4.22–5.81)
RDW: 12.9 % (ref 11.5–15.5)
WBC: 6.4 10*3/uL (ref 4.0–10.5)

## 2021-12-14 LAB — BASIC METABOLIC PANEL
BUN: 14 mg/dL (ref 6–23)
CO2: 32 mEq/L (ref 19–32)
Calcium: 9 mg/dL (ref 8.4–10.5)
Chloride: 103 mEq/L (ref 96–112)
Creatinine, Ser: 0.84 mg/dL (ref 0.40–1.50)
GFR: 94.92 mL/min (ref >60.00)
Glucose, Bld: 96 mg/dL (ref 70–99)
Potassium: 4.6 mEq/L (ref 3.5–5.1)
Sodium: 138 mEq/L (ref 135–145)

## 2021-12-14 LAB — HEPATIC FUNCTION PANEL
ALT: 37 U/L (ref 0–53)
AST: 24 U/L (ref 0–37)
Albumin: 4.2 g/dL (ref 3.5–5.2)
Alkaline Phosphatase: 53 U/L (ref 39–117)
Bilirubin, Direct: 0.1 mg/dL (ref 0.0–0.3)
Total Bilirubin: 0.6 mg/dL (ref 0.2–1.2)
Total Protein: 6.4 g/dL (ref 6.0–8.3)

## 2021-12-14 LAB — LIPID PANEL
Cholesterol: 186 mg/dL (ref 0–200)
HDL: 65.1 mg/dL (ref >39.00)
LDL Cholesterol: 85 mg/dL (ref 0–99)
NonHDL: 120.66
Total CHOL/HDL Ratio: 3
Triglycerides: 180 mg/dL — ABNORMAL HIGH (ref 0.0–149.0)
VLDL: 36 mg/dL (ref 0.0–40.0)

## 2021-12-14 LAB — HEMOGLOBIN A1C: Hgb A1c MFr Bld: 6.1 % (ref 4.6–6.5)

## 2021-12-15 LAB — PSA, TOTAL WITH REFLEX TO PSA, FREE: PSA, Total: 6.1 ng/mL — ABNORMAL HIGH (ref <=4.0)

## 2021-12-15 LAB — REFLEX PSA, FREE
PSA, % Free: 11 % (calc) — ABNORMAL LOW (ref >25)
PSA, Free: 0.7 ng/mL

## 2021-12-18 ENCOUNTER — Ambulatory Visit: Payer: Self-pay | Admitting: Urology

## 2021-12-21 ENCOUNTER — Encounter: Payer: Self-pay | Admitting: Family Medicine

## 2021-12-21 ENCOUNTER — Other Ambulatory Visit: Payer: Self-pay

## 2021-12-21 ENCOUNTER — Ambulatory Visit (INDEPENDENT_AMBULATORY_CARE_PROVIDER_SITE_OTHER): Payer: 59 | Admitting: Family Medicine

## 2021-12-21 VITALS — BP 118/72 | HR 79 | Temp 98.7°F | Ht 69.0 in | Wt 228.4 lb

## 2021-12-21 DIAGNOSIS — Z23 Encounter for immunization: Secondary | ICD-10-CM | POA: Diagnosis not present

## 2021-12-21 DIAGNOSIS — Z1211 Encounter for screening for malignant neoplasm of colon: Secondary | ICD-10-CM | POA: Diagnosis not present

## 2021-12-21 DIAGNOSIS — Z0000 Encounter for general adult medical examination without abnormal findings: Secondary | ICD-10-CM | POA: Diagnosis not present

## 2021-12-21 NOTE — Progress Notes (Signed)
Luceil Herrin T. Keayra Graham, MD, [place] at [facility] [place], [address]  Phone: [phone]   FAX: [phone]  Jason Coffey - 61 y.o. male   MRN [id number]   Date of Birth: 31-May-1961  Date: 12/21/2021   PCP: Owens Loffler, MD   Referral: Owens Loffler, MD  Chief Complaint  Patient presents with   Annual Exam    This visit occurred during the SARS-CoV-2 public health emergency.  Safety protocols were in place, including screening questions prior to the visit, additional usage of staff PPE, and extensive cleaning of exam room while observing appropriate contact time as indicated for disinfecting solutions.   Patient Care Team: Owens Loffler, MD as PCP - General Subjective:   Jason Coffey is a 61 y.o. pleasant patient who presents with the following:  Preventative Health Maintenance Visit:  Health Maintenance Summary Reviewed and updated, unless pt declines services.  Tobacco History Reviewed. Alcohol: 1-2 Exercise Habits: with ongoing neck pain, weakness, and rad, decreased and no tennis STD concerns: no risk or activity to increase risk Drug Use: None  Shingrix Covid Bivalent Colon -  3 months  Neck is still not going very well.  Went to [facility] about his knee - did do some therapy with some slow improvement.  R side is getting better.  Still with a lot of str decrease with UE  Urology is following his PSA  Health Maintenance  Topic Date Due   COVID-19 Vaccine (5 - Booster for Pfizer series) 02/24/2021   Zoster Vaccines- Shingrix (2 of 2) 02/15/2022   COLONOSCOPY (Pts 45-42yrs Insurance coverage will need to be confirmed)  03/11/2022   TETANUS/TDAP  11/01/2025   INFLUENZA VACCINE  Completed   Hepatitis C Screening  Completed   HIV Screening  Completed   HPV VACCINES  Aged Out   Immunization History  Administered Date(s) Administered   Influenza,inj,Quad PF,6+ Mos 10/25/2016, 09/21/2017,  08/17/2018, 12/02/2019, 08/10/2020, 09/14/2021   PFIZER(Purple Top)SARS-COV-2 Vaccination 01/21/2020, 02/11/2020, 08/25/2020, 12/30/2020   Td 10/29/2004   Tdap 11/02/2015   Zoster Recombinat (Shingrix) 12/21/2021   Patient Active Problem List   Diagnosis Date Noted   Cervical radiculopathy 09/20/2021   Elevated PSA 02/03/2020   Erectile dysfunction 04/27/2011   ALLERGIC RHINITIS 12/08/2008    Past Medical History:  Diagnosis Date   Allergic rhinitis    hay fever   Colon polyp    GERD (gastroesophageal reflux disease)    every 4 months or so   Lactose intolerance     Past Surgical History:  Procedure Laterality Date   [place] SURGERY  10/29/2004   L4-5   MTP surgery  10/29/1996   1st, right, toe surgery    Family History  Problem Relation Age of Onset   Stroke Paternal Grandfather    Colon cancer Neg Hx     Past Medical History, Surgical History, Social History, Family History, Problem List, Medications, and Allergies have been reviewed and updated if relevant.  Review of Systems: Pertinent positives are listed above.  Otherwise, a full 14 point review of systems has been done in full and it is negative except where it is noted positive.  Objective:   BP 118/72    Pulse 79    Temp 98.7 F (37.1 C) (Temporal)    Ht 5\' 9"  (1.753 m)    Wt 228 lb 7 oz (103.6 kg)    SpO2 97%  BMI 33.73 kg/m  Ideal Body Weight: Weight in (lb) to have BMI = 25: 168.9  Ideal Body Weight: Weight in (lb) to have BMI = 25: 168.9 No results found. Depression screen [facility] 2/9 12/21/2021 12/05/2020 12/02/2019 10/30/2018 10/23/2017  Decreased Interest 0 0 0 0 0  Down, Depressed, Hopeless 0 0 0 0 0  PHQ - 2 Score 0 0 0 0 0     GEN: well developed, well nourished, no acute distress Eyes: conjunctiva and lids normal, PERRLA, EOMI ENT: TM clear, nares clear, oral exam WNL Neck: supple, no lymphadenopathy, no thyromegaly, no JVD Pulm: clear to auscultation and  percussion, respiratory effort normal CV: regular rate and rhythm, S1-S2, no murmur, rub or gallop, no bruits, peripheral pulses normal and symmetric, no cyanosis, clubbing, edema or varicosities GI: soft, non-tender; no hepatosplenomegaly, masses; active bowel sounds all quadrants GU: deferred Lymph: no cervical, axillary or inguinal adenopathy MSK: gait normal, muscle tone and strength WNL, no joint swelling, effusions, discoloration, crepitus  SKIN: clear, good turgor, color WNL, no rashes, lesions, or ulcerations Neuro: normal mental status, normal strength, sensation, and motion Psych: alert; oriented to person, place and time, normally interactive and not anxious or depressed in appearance.  All labs reviewed with patient. Results for orders placed or performed in visit on 12/14/21  PSA, Total with Reflex to PSA, Free  Result Value Ref Range   PSA, Total 6.1 (H) < OR = 4.0 ng/mL  Hemoglobin A1c  Result Value Ref Range   Hgb A1c MFr Bld 6.1 4.6 - 6.5 %  CBC with Differential/Platelet  Result Value Ref Range   WBC 6.4 4.0 - 10.5 K/uL   RBC 4.98 4.22 - 5.81 Mil/uL   Hemoglobin 14.9 13.0 - 17.0 g/dL   HCT 45.1 39.0 - 52.0 %   MCV 90.6 78.0 - 100.0 fl   MCHC 33.1 30.0 - 36.0 g/dL   RDW 12.9 11.5 - 15.5 %   Platelets 183.0 150.0 - 400.0 K/uL   Neutrophils Relative % 62.2 43.0 - 77.0 %   Lymphocytes Relative 25.9 12.0 - 46.0 %   Monocytes Relative 7.6 3.0 - 12.0 %   Eosinophils Relative 3.4 0.0 - 5.0 %   Basophils Relative 0.9 0.0 - 3.0 %   Neutro Abs 4.0 1.4 - 7.7 K/uL   Lymphs Abs 1.7 0.7 - 4.0 K/uL   Monocytes Absolute 0.5 0.1 - 1.0 K/uL   Eosinophils Absolute 0.2 0.0 - 0.7 K/uL   Basophils Absolute 0.1 0.0 - 0.1 K/uL  Basic metabolic panel  Result Value Ref Range   Sodium 138 135 - 145 mEq/L   Potassium 4.6 3.5 - 5.1 mEq/L   Chloride 103 96 - 112 mEq/L   CO2 32 19 - 32 mEq/L   Glucose, Bld 96 70 - 99 mg/dL   BUN 14 6 - 23 mg/dL   Creatinine, Ser 0.84 0.40 - 1.50 mg/dL    GFR 94.92 >60.00 mL/min   Calcium 9.0 8.4 - 10.5 mg/dL  Hepatic function panel  Result Value Ref Range   Total Bilirubin 0.6 0.2 - 1.2 mg/dL   Bilirubin, Direct 0.1 0.0 - 0.3 mg/dL   Alkaline Phosphatase 53 39 - 117 U/L   AST 24 0 - 37 U/L   ALT 37 0 - 53 U/L   Total Protein 6.4 6.0 - 8.3 g/dL   Albumin 4.2 3.5 - 5.2 g/dL  Lipid panel  Result Value Ref Range   Cholesterol 186 0 - 200 mg/dL  Triglycerides 180.0 (H) 0.0 - 149.0 mg/dL   HDL 65.10 >39.00 mg/dL   VLDL 36.0 0.0 - 40.0 mg/dL   LDL Cholesterol 85 0 - 99 mg/dL   Total CHOL/HDL Ratio 3    NonHDL 120.66   reflex PSA, Free  Result Value Ref Range   PSA, Free 0.7 ng/mL   PSA, % Free 11 (L) >25 % (calc)    Assessment and Plan:     ICD-10-CM   1. Healthcare maintenance  Z00.00 Varicella-zoster vaccine IM (Shingrix)    2. Screening for malignant neoplasm of colon  Z12.11 Ambulatory referral to Gastroenterology    3. Need for shingles vaccine  Z23 Varicella-zoster vaccine IM (Shingrix)     Globally, he is doing well with the exception of his neck.  He has gained some weight, and he is not able to be as active.  I think that working on his weight as well as trying to get the activity as he can would be primary goal here.  We will also do Shingrix today.  Set up for colonoscopy.  Other than neck, minimal concern.  Health Maintenance Exam: The patient's preventative maintenance and recommended screening tests for an annual wellness exam were reviewed in full today. Brought up to date unless services declined.  Counselled on the importance of diet, exercise, and its role in overall health and mortality. The patient's FH and SH was reviewed, including their home life, tobacco status, and drug and alcohol status.  Follow-up in 1 year for physical exam or additional follow-up below.  Follow-up: No follow-ups on file. Or follow-up in 1 year if not noted.  No orders of the defined types were placed in this  encounter.  Medications Discontinued During This Encounter  Medication Reason   cyclobenzaprine (FLEXERIL) 10 MG tablet Completed Course   meloxicam (MOBIC) 15 MG tablet Completed Course   predniSONE (DELTASONE) 20 MG tablet Completed Course   Orders Placed This Encounter  Procedures   Varicella-zoster vaccine IM (Shingrix)   Ambulatory referral to Gastroenterology    Signed,  Frederico Hamman T. Marypat Kimmet, MD   Allergies as of 12/21/2021       Reactions   Tape Rash, Other (See Comments)   Blisters and bleeding        Medication List        Accurate as of December 21, 2021 11:59 PM. If you have any questions, ask your nurse or doctor.          STOP taking these medications    cyclobenzaprine 10 MG tablet Commonly known as: FLEXERIL Stopped by: Owens Loffler, MD   meloxicam 15 MG tablet Commonly known as: MOBIC Stopped by: Owens Loffler, MD   predniSONE 20 MG tablet Commonly known as: DELTASONE Stopped by: Owens Loffler, MD       TAKE these medications    fexofenadine 180 MG tablet Commonly known as: ALLEGRA Take 180 mg by mouth daily as needed.   Fish Oil 1000 MG Caps Take 1 capsule by mouth daily.   multivitamin tablet Take 1 tablet by mouth daily.

## 2021-12-21 NOTE — Patient Instructions (Signed)
Herbalist

## 2021-12-22 ENCOUNTER — Encounter: Payer: Self-pay | Admitting: Family Medicine

## 2021-12-27 ENCOUNTER — Other Ambulatory Visit: Payer: Self-pay

## 2021-12-27 ENCOUNTER — Encounter: Payer: Self-pay | Admitting: Urology

## 2021-12-27 ENCOUNTER — Ambulatory Visit (INDEPENDENT_AMBULATORY_CARE_PROVIDER_SITE_OTHER): Payer: 59 | Admitting: Urology

## 2021-12-27 VITALS — BP 146/80 | HR 75 | Ht 69.0 in | Wt 227.0 lb

## 2021-12-27 DIAGNOSIS — R972 Elevated prostate specific antigen [PSA]: Secondary | ICD-10-CM

## 2021-12-27 NOTE — Progress Notes (Signed)
? ?12/27/2021 ?9:58 AM  ? ?Jason Coffey ?1960-10-31 ?[id number] ? ?Referring provider: Owens Loffler, MD ?[place] ?[place],  [place] [address] ? ?Chief Complaint  ?Patient presents with  ? Elevated PSA  ? ? ?Urologic history: ?1. elevated PSA ?-Biopsy 03/2018; PSA 5.58 (4K 24%); 50 g prostate; benign pathology ?-Prostate MRI in 03/29/2020; 30 cc gland; no suspicious lesions, PI-RADS 1 ? ? ?HPI: ?61 y.o. male presents for follow-up. ? ?No bothersome LUTS ?Denies dysuria, gross hematuria ?Denies flank, abdominal or pelvic pain ?PSA 04/2021 bumped to 7.1 but back to baseline at 6.1 on 12/24/2021 ? ?9/14     2.46 ?10/15   2.67  ?12/16   3.4 ?12/17   4.6 ?2/18     3.3 ?12/18   5.58 ?2/19     4.5 ?5/19     4.18 (4K 24%) ?12/19   8.0 ?1/21     6.7 ?1/22     6.8 ?7/22     7.1 ?2/23     6.1 ? ? ?PMH: ?Past Medical History:  ?Diagnosis Date  ? Allergic rhinitis   ? hay fever  ? Colon polyp   ? GERD (gastroesophageal reflux disease)   ? every 4 months or so  ? ? ?Surgical History: ?Past Surgical History:  ?Procedure Laterality Date  ? [place]  ? [place] SURGERY  10/29/2004  ? L4-5  ? MTP surgery  10/29/1996  ? 1st, right, toe surgery  ? ? ?Home Medications:  ?Allergies as of 12/27/2021   ? ?   Reactions  ? Tape Rash, Other (See Comments)  ? Blisters and bleeding  ? ?  ? ?  ?Medication List  ?  ? ?  ? Accurate as of December 27, 2021  9:58 AM. If you have any questions, ask your nurse or doctor.  ?  ?  ? ?  ? ?fexofenadine 180 MG tablet ?Commonly known as: ALLEGRA ?Take 180 mg by mouth daily as needed. ?  ?Fish Oil 1000 MG Caps ?Take 1 capsule by mouth daily. ?  ?multivitamin tablet ?Take 1 tablet by mouth daily. ?  ? ?  ? ? ?Allergies:  ?Allergies  ?Allergen Reactions  ? Tape Rash and Other (See Comments)  ?  Blisters and bleeding  ? ? ?Family History: ?Family History  ?Problem Relation Age of Onset  ? Stroke Paternal Grandfather   ? Colon cancer Neg Hx   ? ? ?Social History:  reports that he quit  smoking about 34 years ago. His smoking use included cigarettes. He has never used smokeless tobacco. He reports current alcohol use of about 14.0 standard drinks per week. He reports that he does not use drugs. ? ? ?Physical Exam: ?BP (!) 146/80   Pulse 75   Ht 5\' 9"  (1.753 m)   Wt 227 lb (103 kg)   BMI 33.52 kg/m?   ?Constitutional:  Alert and oriented, No acute distress. ?HEENT: [place] AT, moist mucus membranes.  Trachea midline, no masses. ?Cardiovascular: No clubbing, cyanosis, or edema. ?Respiratory: Normal respiratory effort, no increased work of breathing. ?GI: Abdomen is soft, nontender, nondistended, no abdominal masses ?GU: Prostate 40 g, smooth without nodules ?Skin: No rashes, bruises or suspicious lesions. ?Neurologic: Grossly intact, no focal deficits, moving all 4 extremities. ?Psychiatric: Normal mood and affect. ? ? ?Assessment & Plan:   ? ?1.  Elevated PSA ?Stable ?Benign DRE ?Continue every 6 month PSA and annual DRE ? ? ?Abbie Sons, MD ? ?[facility] ?[address] ?[place], [place] [address] ?[phone] ? ?

## 2021-12-27 NOTE — Addendum Note (Signed)
Addended by: Kyra Manges on: 12/27/2021 02:02 PM ? ? Modules accepted: Orders ? ?

## 2022-01-08 ENCOUNTER — Encounter: Payer: Self-pay | Admitting: Internal Medicine

## 2022-03-14 ENCOUNTER — Encounter: Payer: 59 | Admitting: Internal Medicine

## 2022-03-15 ENCOUNTER — Ambulatory Visit (INDEPENDENT_AMBULATORY_CARE_PROVIDER_SITE_OTHER): Payer: 59

## 2022-03-15 DIAGNOSIS — Z23 Encounter for immunization: Secondary | ICD-10-CM

## 2022-06-11 ENCOUNTER — Encounter: Payer: Self-pay | Admitting: Internal Medicine

## 2022-07-03 ENCOUNTER — Other Ambulatory Visit: Payer: 59

## 2022-07-03 DIAGNOSIS — R972 Elevated prostate specific antigen [PSA]: Secondary | ICD-10-CM

## 2022-07-04 ENCOUNTER — Encounter: Payer: Self-pay | Admitting: Urology

## 2022-07-04 LAB — PSA: Prostate Specific Ag, Serum: 7 ng/mL — ABNORMAL HIGH (ref 0.0–4.0)

## 2022-07-13 ENCOUNTER — Telehealth (INDEPENDENT_AMBULATORY_CARE_PROVIDER_SITE_OTHER): Payer: 59 | Admitting: Family Medicine

## 2022-07-13 ENCOUNTER — Encounter: Payer: Self-pay | Admitting: Family Medicine

## 2022-07-13 VITALS — Temp 99.6°F

## 2022-07-13 DIAGNOSIS — U071 COVID-19: Secondary | ICD-10-CM | POA: Diagnosis not present

## 2022-07-13 MED ORDER — NIRMATRELVIR/RITONAVIR (PAXLOVID)TABLET: 3.0000 | ORAL_TABLET | Freq: Two times a day (BID) | ORAL | 0 refills | Status: AC

## 2022-07-13 NOTE — Patient Instructions (Signed)
Person Under Monitoring Name: Jason Coffey  Location: [address] [place] [address]   Infection Prevention Recommendations for Individuals Confirmed to have, or Being Evaluated for, 2019 Novel Coronavirus (COVID-19) Infection Who Receive Care at Home  Individuals who are confirmed to have, or are being evaluated for, COVID-19 should follow the prevention steps below until a healthcare provider or local or state health department says they can return to normal activities.  Stay home except to get medical care You should restrict activities outside your home, except for getting medical care. Do not go to work, school, or public areas, and do not use public transportation or taxis.  Call ahead before visiting your doctor Before your medical appointment, call the healthcare provider and tell them that you have, or are being evaluated for, COVID-19 infection. This will help the healthcare provider's office take steps to keep other people from getting infected. Ask your healthcare provider to call the local or state health department.  Monitor your symptoms Seek prompt medical attention if your illness is worsening (e.g., difficulty breathing). Before going to your medical appointment, call the healthcare provider and tell them that you have, or are being evaluated for, COVID-19 infection. Ask your healthcare provider to call the local or state health department.  Wear a facemask You should wear a facemask that covers your nose and mouth when you are in the same room with other people and when you visit a healthcare provider. People who live with or visit you should also wear a facemask while they are in the same room with you.  Separate yourself from other people in your home As much as possible, you should stay in a different room from other people in your home. Also, you should use a separate bathroom, if available.  Avoid sharing household items You should not share  dishes, drinking glasses, cups, eating utensils, towels, bedding, or other items with other people in your home. After using these items, you should wash them thoroughly with soap and water.  Cover your coughs and sneezes Cover your mouth and nose with a tissue when you cough or sneeze, or you can cough or sneeze into your sleeve. Throw used tissues in a lined trash can, and immediately wash your hands with soap and water for at least 20 seconds or use an alcohol-based hand rub.  Wash your [facility] your hands often and thoroughly with soap and water for at least 20 seconds. You can use an alcohol-based hand sanitizer if soap and water are not available and if your hands are not visibly dirty. Avoid touching your eyes, nose, and mouth with unwashed hands.   Prevention Steps for Caregivers and Household Members of Individuals Confirmed to have, or Being Evaluated for, COVID-19 Infection Being Cared for in the Home  If you live with, or provide care at home for, a person confirmed to have, or being evaluated for, COVID-19 infection please follow these guidelines to prevent infection:  Follow healthcare provider's instructions Make sure that you understand and can help the patient follow any healthcare provider instructions for all care.  Provide for the patient's basic needs You should help the patient with basic needs in the home and provide support for getting groceries, prescriptions, and other personal needs.  Monitor the patient's symptoms If they are getting sicker, call his or her medical provider and tell them that the patient has, or is being evaluated for, COVID-19 infection. This will help the healthcare provider's office take  steps to keep other people from getting infected. Ask the healthcare provider to call the local or state health department.  Limit the number of people who have contact with the patient If possible, have only one caregiver for the patient. Other  household members should stay in another home or place of residence. If this is not possible, they should stay in another room, or be separated from the patient as much as possible. Use a separate bathroom, if available. Restrict visitors who do not have an essential need to be in the home.  Keep older adults, very young children, and other sick people away from the patient Keep older adults, very young children, and those who have compromised immune systems or chronic health conditions away from the patient. This includes people with chronic heart, lung, or kidney conditions, diabetes, and cancer.  Ensure good ventilation Make sure that shared spaces in the home have good air flow, such as from an air conditioner or an opened window, weather permitting.  Wash your hands often Wash your hands often and thoroughly with soap and water for at least 20 seconds. You can use an alcohol based hand sanitizer if soap and water are not available and if your hands are not visibly dirty. Avoid touching your eyes, nose, and mouth with unwashed hands. Use disposable paper towels to dry your hands. If not available, use dedicated cloth towels and replace them when they become wet.  Wear a facemask and gloves Wear a disposable facemask at all times in the room and gloves when you touch or have contact with the patient's blood, body fluids, and/or secretions or excretions, such as sweat, saliva, sputum, nasal mucus, vomit, urine, or feces.  Ensure the mask fits over your nose and mouth tightly, and do not touch it during use. Throw out disposable facemasks and gloves after using them. Do not reuse. Wash your hands immediately after removing your facemask and gloves. If your personal clothing becomes contaminated, carefully remove clothing and launder. Wash your hands after handling contaminated clothing. Place all used disposable facemasks, gloves, and other waste in a lined container before disposing them with  other household waste. Remove gloves and wash your hands immediately after handling these items.  Do not share dishes, glasses, or other household items with the patient Avoid sharing household items. You should not share dishes, drinking glasses, cups, eating utensils, towels, bedding, or other items with a patient who is confirmed to have, or being evaluated for, COVID-19 infection. After the person uses these items, you should wash them thoroughly with soap and water.  Wash laundry thoroughly Immediately remove and wash clothes or bedding that have blood, body fluids, and/or secretions or excretions, such as sweat, saliva, sputum, nasal mucus, vomit, urine, or feces, on them. Wear gloves when handling laundry from the patient. Read and follow directions on labels of laundry or clothing items and detergent. In general, wash and dry with the warmest temperatures recommended on the label.  Clean all areas the individual has used often Clean all touchable surfaces, such as counters, tabletops, doorknobs, bathroom fixtures, toilets, phones, keyboards, tablets, and bedside tables, every day. Also, clean any surfaces that may have blood, body fluids, and/or secretions or excretions on them. Wear gloves when cleaning surfaces the patient has come in contact with. Use a diluted bleach solution (e.g., dilute bleach with 1 part bleach and 10 parts water) or a household disinfectant with a label that says EPA-registered for coronaviruses. To make a bleach solution  at home, add 1 tablespoon of bleach to 1 quart (4 cups) of water. For a larger supply, add  cup of bleach to 1 gallon (16 cups) of water. Read labels of cleaning products and follow recommendations provided on product labels. Labels contain instructions for safe and effective use of the cleaning product including precautions you should take when applying the product, such as wearing gloves or eye protection and making sure you have good ventilation  during use of the product. Remove gloves and wash hands immediately after cleaning.  Monitor yourself for signs and symptoms of illness Caregivers and household members are considered close contacts, should monitor their health, and will be asked to limit movement outside of the home to the extent possible. Follow the monitoring steps for close contacts listed on the symptom monitoring form.   ? If you have additional questions, contact your local health department or call the epidemiologist on call at [phone] (available 24/7). ? This guidance is subject to change. For the most up-to-date guidance from [facility], please refer to their website: [email or web address]

## 2022-07-13 NOTE — Assessment & Plan Note (Signed)
COVID19  Infection < 5 days from onset of symptoms in 4 x vaccinated overweight individual with history of smoking and allergies.  No clear sign of bacterial infection at this time.   No SOB.  No red flags/need for ER visit or in-person exam at respiratory clinic at this time..    Pt higher risk for COVID complications given  *age. GFR  98 and no medication contraindications.  Start paxlovid 5 day course. Reviewed course of medication and side effect profile with patient in detail.   Symptomatic care with mucinex and cough suppressant at night. If SOB begins symptoms worsening.. have low threshold for in-person exam, if severe shortness of breath ER visit recommended.  Can monitor Oxygen saturation at home with home monitor if able to obtain.  Go to ER if O2 sat < 90% on room air.   Reviewed home care and provided information through [facility].  Recommended quarantine 5 days isolation recommended. Return to work day 6 and wear mask for 4 more days to complete 10 days. Provided info about prevention of spread of COVID 19.

## 2022-07-13 NOTE — Progress Notes (Signed)
VIRTUAL VISIT A virtual visit is felt to be most appropriate for this patient at this time.   I connected with the patient on 07/13/22 at 11:20 AM EDT by virtual telehealth platform and verified that I am speaking with the correct person using two identifiers.   I discussed the limitations, risks, security and privacy concerns of performing an evaluation and management service by  virtual telehealth platform and the availability of in person appointments. I also discussed with the patient that there may be a patient responsible charge related to this service. The patient expressed understanding and agreed to proceed.  Patient location: Home Provider Location: [facility] Participants: Eliezer Lofts and Violet Baldy   Chief Complaint  Patient presents with   Covid Positive    Positive home test last night. Symptoms started Wednesday   Generalized Body Aches   Fatigue   Headache   Cough   Nasal Congestion    History of Present Illness: 61 year old patient of Dr. Lorelei Pont with history of allergic rhinitis and former smoking presents with new onset COVID-19 infection.  Date of onset: July 11, 2022 He reports symptoms started with fatigue. Symptoms progressed to body ache, headache, nasal congestion and productive cough. Low grade fever 99.6 F, chills. NO SOB, no wheeze.  He has tried to treat with AlkaSeltzer cold and flu.  Cough is not keeping him up at night.  No history of chronic lung disease, former smoker  COVID 19 screen COVID testing:positive 9/14 COVID vaccine: 5 vaccines for COVID. COVID exposure: he did travel over the weekend prior.  The importance of social distancing was discussed today.    Review of Systems  Constitutional:  Positive for chills, fever and malaise/fatigue.  HENT:  Positive for congestion and sore throat. Negative for ear pain.   Eyes:  Negative for pain and redness.  Respiratory:  Positive for cough. Negative for shortness of  breath.   Cardiovascular:  Negative for chest pain, palpitations and leg swelling.  Gastrointestinal:  Negative for abdominal pain, blood in stool, constipation, diarrhea, nausea and vomiting.  Genitourinary:  Negative for dysuria.  Musculoskeletal:  Positive for myalgias. Negative for falls.  Skin:  Negative for rash.  Neurological:  Negative for dizziness.  Psychiatric/Behavioral:  Negative for depression. The patient is not nervous/anxious.       Past Medical History:  Diagnosis Date   Allergic rhinitis    hay fever   Colon polyp    GERD (gastroesophageal reflux disease)    every 4 months or so    reports that he quit smoking about 34 years ago. His smoking use included cigarettes. He has never used smokeless tobacco. He reports current alcohol use of about 14.0 standard drinks of alcohol per week. He reports that he does not use drugs.   Current Outpatient Medications:    fexofenadine (ALLEGRA) 180 MG tablet, Take 180 mg by mouth daily as needed., Disp: , Rfl: 5   Multiple Vitamin (MULTIVITAMIN) tablet, Take 1 tablet by mouth daily., Disp: , Rfl:    Omega-3 Fatty Acids (FISH OIL) 1000 MG CAPS, Take 1 capsule by mouth daily., Disp: , Rfl:   Current Facility-Administered Medications:    0.9 %  sodium chloride infusion, 500 mL, Intravenous, Continuous, Pyrtle, Lajuan Lines, MD   Observations/Objective: Temperature 99.6 F (37.6 C), temperature source Temporal.  Physical Exam  Physical Exam Constitutional:      General: The patient is not in acute distress. Pulmonary:     Effort: Pulmonary effort is  normal. No respiratory distress.  Neurological:     Mental Status: The patient is alert and oriented to person, place, and time.  Psychiatric:        Mood and Affect: Mood normal.        Behavior: Behavior normal.   Assessment and Plan    Problem List Items Addressed This Visit     COVID-19 - Primary    COVID19  Infection < 5 days from onset of symptoms in 4 x vaccinated  overweight individual with history of smoking and allergies.  No clear sign of bacterial infection at this time.   No SOB.  No red flags/need for ER visit or in-person exam at respiratory clinic at this time..    Pt higher risk for COVID complications given  *age. GFR  98 and no medication contraindications.  Start paxlovid 5 day course. Reviewed course of medication and side effect profile with patient in detail.   Symptomatic care with mucinex and cough suppressant at night. If SOB begins symptoms worsening.. have low threshold for in-person exam, if severe shortness of breath ER visit recommended.  Can monitor Oxygen saturation at home with home monitor if able to obtain.  Go to ER if O2 sat < 90% on room air.   Reviewed home care and provided information through [facility].  Recommended quarantine 5 days isolation recommended. Return to work day 6 and wear mask for 4 more days to complete 10 days. Provided info about prevention of spread of COVID 19.       Relevant Medications   nirmatrelvir/ritonavir EUA (PAXLOVID) 20 x 150 MG & 10 x '100MG'$  TABS    I discussed the assessment and treatment plan with the patient. The patient was provided an opportunity to ask questions and all were answered. The patient agreed with the plan and demonstrated an understanding of the instructions.   The patient was advised to call back or seek an in-person evaluation if the symptoms worsen or if the condition fails to improve as anticipated.     Eliezer Lofts, MD

## 2022-07-23 ENCOUNTER — Ambulatory Visit (AMBULATORY_SURGERY_CENTER): Payer: Self-pay

## 2022-07-23 ENCOUNTER — Other Ambulatory Visit: Payer: Self-pay

## 2022-07-23 VITALS — Ht 69.0 in | Wt 229.2 lb

## 2022-07-23 DIAGNOSIS — Z86010 Personal history of colonic polyps: Secondary | ICD-10-CM

## 2022-07-23 MED ORDER — PEG 3350-KCL-NA BICARB-NACL 420 G PO SOLR: 4000.0000 mL | Freq: Once | ORAL | 0 refills | Status: AC

## 2022-07-23 NOTE — Progress Notes (Signed)
Denies allergies to eggs or soy products. Denies complication of anesthesia or sedation. Denies use of weight loss medication. Denies use of O2.   Emmi instructions given for colonoscopy.  

## 2022-07-27 ENCOUNTER — Encounter: Payer: Self-pay | Admitting: Internal Medicine

## 2022-07-27 ENCOUNTER — Encounter: Payer: Self-pay | Admitting: Urology

## 2022-08-13 ENCOUNTER — Ambulatory Visit (AMBULATORY_SURGERY_CENTER): Payer: 59 | Admitting: Internal Medicine

## 2022-08-13 ENCOUNTER — Encounter: Payer: Self-pay | Admitting: Internal Medicine

## 2022-08-13 VITALS — BP 127/79 | HR 66 | Temp 98.4°F | Resp 8 | Ht 69.0 in | Wt 229.2 lb

## 2022-08-13 DIAGNOSIS — Z86010 Personal history of colonic polyps: Secondary | ICD-10-CM | POA: Diagnosis not present

## 2022-08-13 DIAGNOSIS — D125 Benign neoplasm of sigmoid colon: Secondary | ICD-10-CM

## 2022-08-13 DIAGNOSIS — Z09 Encounter for follow-up examination after completed treatment for conditions other than malignant neoplasm: Secondary | ICD-10-CM | POA: Diagnosis present

## 2022-08-13 DIAGNOSIS — D124 Benign neoplasm of descending colon: Secondary | ICD-10-CM

## 2022-08-13 MED ORDER — SODIUM CHLORIDE 0.9 % IV SOLN: 500.0000 mL | Freq: Once | INTRAVENOUS | Status: DC

## 2022-08-13 NOTE — Progress Notes (Signed)
Called to room to assist during endoscopic procedure.  Patient ID and intended procedure confirmed with present staff. Received instructions for my participation in the procedure from the performing physician.  

## 2022-08-13 NOTE — Op Note (Signed)
[facility] Patient Name: Jason Coffey Procedure Date: 08/13/2022 9:47 AM MRN: [id number] Endoscopist: Jerene Bears , MD Age: 61 Referring MD:  Date of Birth: May 18, 1961 Gender: Male Account #: [id number] Procedure:                Colonoscopy Indications:              Surveillance: Personal history of adenomatous                            polyps on last colonoscopy 5 years ago, hx of SSP                            on previous exam, Last colonoscopy: May 2018 Medicines:                Monitored Anesthesia Care Procedure:                Pre-Anesthesia Assessment:                           - Prior to the procedure, a History and Physical                            was performed, and patient medications and                            allergies were reviewed. The patient's tolerance of                            previous anesthesia was also reviewed. The risks                            and benefits of the procedure and the sedation                            options and risks were discussed with the patient.                            All questions were answered, and informed consent                            was obtained. Prior Anticoagulants: The patient has                            taken no previous anticoagulant or antiplatelet                            agents. ASA Grade Assessment: II - A patient with                            mild systemic disease. After reviewing the risks                            and benefits, the patient was deemed in  satisfactory condition to undergo the procedure.                           After obtaining informed consent, the colonoscope                            was passed under direct vision. Throughout the                            procedure, the patient's blood pressure, pulse, and                            oxygen saturations were monitored continuously. The                            CF [id number] #[id number]  was introduced through the anus                            and advanced to the cecum, identified by                            appendiceal orifice and ileocecal valve. The                            colonoscopy was performed without difficulty. The                            patient tolerated the procedure well. The quality                            of the bowel preparation was good. The ileocecal                            valve, appendiceal orifice, and rectum were                            photographed. Scope In: 10:01:28 AM Scope Out: 10:16:08 AM Scope Withdrawal Time: 0 hours 12 minutes 23 seconds  Total Procedure Duration: 0 hours 14 minutes 40 seconds  Findings:                 The digital rectal exam was normal.                           A 5 mm polyp was found in the descending colon. The                            polyp was sessile. The polyp was removed with a                            cold snare. Resection and retrieval were complete.                           A 4 mm polyp was found in the distal sigmoid colon.  The polyp was sessile. The polyp was removed with a                            cold snare. Resection and retrieval were complete.                           A few small-mouthed diverticula were found in the                            transverse colon.                           The retroflexed view of the distal rectum and anal                            verge was normal and showed no anal or rectal                            abnormalities. Complications:            No immediate complications. Estimated Blood Loss:     Estimated blood loss: none. Impression:               - One 5 mm polyp in the descending colon, removed                            with a cold snare. Resected and retrieved.                           - One 4 mm polyp in the distal sigmoid colon,                            removed with a cold snare. Resected and retrieved.                            - Mild diverticulosis in the transverse colon.                           - The distal rectum and anal verge are normal on                            retroflexion view. Recommendation:           - Patient has a contact number available for                            emergencies. The signs and symptoms of potential                            delayed complications were discussed with the                            patient. Return to normal activities tomorrow.  Written discharge instructions were provided to the                            patient.                           - Resume previous diet.                           - Continue present medications.                           - Await pathology results.                           - Repeat colonoscopy is recommended for                            surveillance. The colonoscopy date will be                            determined after pathology results from today's                            exam become available for review. Jerene Bears, MD 08/13/2022 10:19:46 AM This report has been signed electronically.

## 2022-08-13 NOTE — Patient Instructions (Signed)
Information on polyps, diverticulosis given to you today.  Await pathology results.  Resume previous diet and medications.    YOU HAD AN ENDOSCOPIC PROCEDURE TODAY AT [facility]:   Refer to the procedure report that was given to you for any specific questions about what was found during the examination.  If the procedure report does not answer your questions, please call your gastroenterologist to clarify.  If you requested that your care partner not be given the details of your procedure findings, then the procedure report has been included in a sealed envelope for you to review at your convenience later.  YOU SHOULD EXPECT: Some feelings of bloating in the abdomen. Passage of more gas than usual.  Walking can help get rid of the air that was put into your GI tract during the procedure and reduce the bloating. If you had a lower endoscopy (such as a colonoscopy or flexible sigmoidoscopy) you may notice spotting of blood in your stool or on the toilet paper. If you underwent a bowel prep for your procedure, you may not have a normal bowel movement for a few days.  Please Note:  You might notice some irritation and congestion in your nose or some drainage.  This is from the oxygen used during your procedure.  There is no need for concern and it should clear up in a day or so.  SYMPTOMS TO REPORT IMMEDIATELY:  Following lower endoscopy (colonoscopy or flexible sigmoidoscopy):  Excessive amounts of blood in the stool  Significant tenderness or worsening of abdominal pains  Swelling of the abdomen that is new, acute  Fever of 100F or higher   For urgent or emergent issues, a gastroenterologist can be reached at any hour by calling [phone]. Do not use MyChart messaging for urgent concerns.    DIET:  We do recommend a small meal at first, but then you may proceed to your regular diet.  Drink plenty of fluids but you should avoid alcoholic beverages for 24  hours.  ACTIVITY:  You should plan to take it easy for the rest of today and you should NOT DRIVE or use heavy machinery until tomorrow (because of the sedation medicines used during the test).    FOLLOW UP: Our staff will call the number listed on your records the next business day following your procedure.  We will call around 7:15- 8:00 am to check on you and address any questions or concerns that you may have regarding the information given to you following your procedure. If we do not reach you, we will leave a message.     If any biopsies were taken you will be contacted by phone or by letter within the next 1-3 weeks.  Please call us at [phone] if you have not heard about the biopsies in 3 weeks.    SIGNATURES/CONFIDENTIALITY: You and/or your care partner have signed paperwork which will be entered into your electronic medical record.  These signatures attest to the fact that that the information above on your After Visit Summary has been reviewed and is understood.  Full responsibility of the confidentiality of this discharge information lies with you and/or your care-partner.

## 2022-08-13 NOTE — Progress Notes (Signed)
GASTROENTEROLOGY PROCEDURE H&P NOTE   Primary Care Physician: Owens Loffler, MD    Reason for Procedure:  History of adenomatous and sessile serrated colon polyps  Plan:    Colonoscopy  Patient is appropriate for endoscopic procedure(s) in the ambulatory ([facility]) setting.  The nature of the procedure, as well as the risks, benefits, and alternatives were carefully and thoroughly reviewed with the patient. Ample time for discussion and questions allowed. The patient understood, was satisfied, and agreed to proceed.     HPI: Jason Coffey is a 61 y.o. male who presents for surveillance colonoscopy.  Medical history as below.  Tolerated the prep.  No recent chest pain or shortness of breath.  No abdominal pain today.  Past Medical History:  Diagnosis Date   Allergic rhinitis    hay fever   Allergy    Colon polyp    GERD (gastroesophageal reflux disease)    every 4 months or so    Past Surgical History:  Procedure Laterality Date   CERVICAL [place] SURGERY  10/29/2004   L4-5   MTP surgery  10/29/1996   1st, right, toe surgery    Prior to Admission medications   Medication Sig Start Date End Date Taking? Authorizing Provider  fexofenadine (ALLEGRA) 180 MG tablet Take 180 mg by mouth daily as needed. 02/06/18  Yes [provider]  Multiple Vitamin (MULTIVITAMIN) tablet Take 1 tablet by mouth daily.   Yes [provider]  Omega-3 Fatty Acids (FISH OIL) 1000 MG CAPS Take 1 capsule by mouth daily. 10/30/19  Yes [provider]    Current Outpatient Medications  Medication Sig Dispense Refill   fexofenadine (ALLEGRA) 180 MG tablet Take 180 mg by mouth daily as needed.  5   Multiple Vitamin (MULTIVITAMIN) tablet Take 1 tablet by mouth daily.     Omega-3 Fatty Acids (FISH OIL) 1000 MG CAPS Take 1 capsule by mouth daily.     Current Facility-Administered Medications  Medication Dose Route  Frequency Provider Last Rate Last Admin   0.9 %  sodium chloride infusion  500 mL Intravenous Continuous Janaa Acero, Lajuan Lines, MD       0.9 %  sodium chloride infusion  500 mL Intravenous Once Aleshia Cartelli, Lajuan Lines, MD        Allergies as of 08/13/2022 - Review Complete 08/13/2022  Allergen Reaction Noted   Juniper berries Itching 08/13/2022   Latex Rash 08/13/2022   Mixed feathers Rash and Other (See Comments) 08/13/2022   Tape Rash and Other (See Comments) 08/18/2012    Family History  Problem Relation Age of Onset   Stroke Paternal Grandfather    Colon cancer Neg Hx    Esophageal cancer Neg Hx    Rectal cancer Neg Hx    Stomach cancer Neg Hx     Social History   Socioeconomic History   Marital status: Married    Spouse name: Not on file   Number of children: 1   Years of education: Not on file   Highest education level: Not on file  Occupational History   Occupation: [employer]  Tobacco Use   Smoking status: Former    Types: Cigarettes    Quit date: 10/30/1987    Years since quitting: 34.8   Smokeless tobacco: Never   Tobacco comments:    15 pack year  Substance and Sexual Activity   Alcohol use: Yes  Alcohol/week: 14.0 standard drinks of alcohol    Types: 14 Cans of beer per week    Comment: 14   Drug use: No   Sexual activity: Not on file  Other Topics Concern   Not on file  Social History Narrative   Regular exercise- yes- golf, rare tennis   Social Determinants of Health   Financial Resource Strain: Not on file  Food Insecurity: Not on file  Transportation Needs: Not on file  Physical Activity: Not on file  Stress: Not on file  Social Connections: Not on file  Intimate Partner Violence: Not on file    Physical Exam: Vital signs in last 24 hours: '@BP'$  (!) 153/96   Pulse 64   Temp 98.4 F (36.9 C) (Skin)   Ht '5\' 9"'$  (1.753 m)   Wt 229 lb 3.2 oz (104 kg)   SpO2 98%   BMI 33.85 kg/m  GEN: NAD EYE: Sclerae anicteric ENT: MMM CV:  Non-tachycardic Pulm: CTA b/l GI: Soft, NT/ND NEURO:  Alert & Oriented x 3   Zenovia Jarred, MD [facility]  08/13/2022 9:53 AM

## 2022-08-13 NOTE — Progress Notes (Signed)
Pt resting comfortably. VSS. Airway intact. SBAR complete to RN. All questions answered.   

## 2022-08-13 NOTE — Progress Notes (Signed)
Pt reports left arm/hand radiculopathy -numbness and tingling -prior to procedure. Pt reports intermittent symptoms since neck procedure. BP cuff placement verified. Rapid refill noted to left hand fingers. Pt positioned in left lateral decubitus, head and neck supported per pt comfort prior to sedation starting. EJ

## 2022-08-14 ENCOUNTER — Telehealth: Payer: Self-pay | Admitting: *Deleted

## 2022-08-14 NOTE — Telephone Encounter (Signed)
Left message on f/u call 

## 2022-08-16 ENCOUNTER — Encounter: Payer: Self-pay | Admitting: Internal Medicine

## 2022-12-09 ENCOUNTER — Other Ambulatory Visit: Payer: Self-pay | Admitting: Family Medicine

## 2022-12-09 DIAGNOSIS — R5383 Other fatigue: Secondary | ICD-10-CM

## 2022-12-09 DIAGNOSIS — Z125 Encounter for screening for malignant neoplasm of prostate: Secondary | ICD-10-CM

## 2022-12-09 DIAGNOSIS — Z13220 Encounter for screening for lipoid disorders: Secondary | ICD-10-CM

## 2022-12-09 DIAGNOSIS — Z131 Encounter for screening for diabetes mellitus: Secondary | ICD-10-CM

## 2022-12-17 ENCOUNTER — Other Ambulatory Visit (INDEPENDENT_AMBULATORY_CARE_PROVIDER_SITE_OTHER): Payer: 59

## 2022-12-17 DIAGNOSIS — R5383 Other fatigue: Secondary | ICD-10-CM | POA: Diagnosis not present

## 2022-12-17 DIAGNOSIS — Z125 Encounter for screening for malignant neoplasm of prostate: Secondary | ICD-10-CM

## 2022-12-17 DIAGNOSIS — Z131 Encounter for screening for diabetes mellitus: Secondary | ICD-10-CM

## 2022-12-17 DIAGNOSIS — Z13220 Encounter for screening for lipoid disorders: Secondary | ICD-10-CM

## 2022-12-17 LAB — CBC WITH DIFFERENTIAL/PLATELET
Basophils Absolute: 0.1 10*3/uL (ref 0.0–0.1)
Basophils Relative: 0.8 % (ref 0.0–3.0)
Eosinophils Absolute: 0.2 10*3/uL (ref 0.0–0.7)
Eosinophils Relative: 1.7 % (ref 0.0–5.0)
HCT: 45.7 % (ref 39.0–52.0)
Hemoglobin: 15.6 g/dL (ref 13.0–17.0)
Lymphocytes Relative: 15.1 % (ref 12.0–46.0)
Lymphs Abs: 1.3 10*3/uL (ref 0.7–4.0)
MCHC: 34.1 g/dL (ref 30.0–36.0)
MCV: 89.1 fl (ref 78.0–100.0)
Monocytes Absolute: 0.6 10*3/uL (ref 0.1–1.0)
Monocytes Relative: 6.7 % (ref 3.0–12.0)
Neutro Abs: 6.7 10*3/uL (ref 1.4–7.7)
Neutrophils Relative %: 75.7 % (ref 43.0–77.0)
Platelets: 215 10*3/uL (ref 150.0–400.0)
RBC: 5.13 Mil/uL (ref 4.22–5.81)
RDW: 12.9 % (ref 11.5–15.5)
WBC: 8.8 10*3/uL (ref 4.0–10.5)

## 2022-12-17 LAB — LIPID PANEL
Cholesterol: 189 mg/dL (ref 0–200)
HDL: 53.7 mg/dL (ref >39.00)
LDL Cholesterol: 99 mg/dL (ref 0–99)
NonHDL: 135.57
Total CHOL/HDL Ratio: 4
Triglycerides: 184 mg/dL — ABNORMAL HIGH (ref 0.0–149.0)
VLDL: 36.8 mg/dL (ref 0.0–40.0)

## 2022-12-17 LAB — HEPATIC FUNCTION PANEL
ALT: 23 U/L (ref 0–53)
AST: 17 U/L (ref 0–37)
Albumin: 4.2 g/dL (ref 3.5–5.2)
Alkaline Phosphatase: 54 U/L (ref 39–117)
Bilirubin, Direct: 0.1 mg/dL (ref 0.0–0.3)
Total Bilirubin: 0.6 mg/dL (ref 0.2–1.2)
Total Protein: 7 g/dL (ref 6.0–8.3)

## 2022-12-17 LAB — BASIC METABOLIC PANEL
BUN: 13 mg/dL (ref 6–23)
CO2: 27 mEq/L (ref 19–32)
Calcium: 9.5 mg/dL (ref 8.4–10.5)
Chloride: 100 mEq/L (ref 96–112)
Creatinine, Ser: 0.97 mg/dL (ref 0.40–1.50)
GFR: 84.37 mL/min (ref >60.00)
Glucose, Bld: 105 mg/dL — ABNORMAL HIGH (ref 70–99)
Potassium: 4.6 mEq/L (ref 3.5–5.1)
Sodium: 136 mEq/L (ref 135–145)

## 2022-12-17 LAB — HEMOGLOBIN A1C: Hgb A1c MFr Bld: 5.8 % (ref 4.6–6.5)

## 2022-12-19 ENCOUNTER — Encounter: Payer: Self-pay | Admitting: Family Medicine

## 2022-12-19 LAB — REFLEX PSA, FREE
PSA, % Free: 7 % (calc) — ABNORMAL LOW (ref >25)
PSA, Free: 0.4 ng/mL

## 2022-12-19 LAB — PSA, TOTAL WITH REFLEX TO PSA, FREE: PSA, Total: 6.1 ng/mL — ABNORMAL HIGH (ref <=4.0)

## 2022-12-23 ENCOUNTER — Encounter: Payer: Self-pay | Admitting: Family Medicine

## 2022-12-23 NOTE — Progress Notes (Unsigned)
Jason Myhre T. Andreanna Mikolajczak, MD, [place] at [facility] [place], [address]  Phone: [phone]  FAX: [phone]  Jason Coffey - 62 y.o. male  MRN [id number]  Date of Birth: 09-Jan-1961  Date: 12/24/2022  PCP: Owens Loffler, MD  Referral: Owens Loffler, MD  No chief complaint on file.  Patient Care Team: Owens Loffler, MD as PCP - General Subjective:   Jason Coffey is a 62 y.o. pleasant patient who presents with the following:  Preventative Health Maintenance Visit:  Health Maintenance Summary Reviewed and updated, unless pt declines services.  Tobacco History Reviewed. Alcohol: No concerns, no excessive use Exercise Habits: Some activity, rec at least 30 mins 5 times a week STD concerns: no risk or activity to increase risk Drug Use: None  Flu (we are out) Covid booster    Health Maintenance  Topic Date Due   INFLUENZA VACCINE  05/29/2022   COVID-19 Vaccine (5 - 2023-24 season) 06/29/2022   DTaP/Tdap/Td (3 - Td or Tdap) 11/01/2025   COLONOSCOPY (Pts 45-47yr Insurance coverage will need to be confirmed)  08/13/2029   Hepatitis C Screening  Completed   HIV Screening  Completed   Zoster Vaccines- Shingrix  Completed   HPV VACCINES  Aged Out   Immunization History  Administered Date(s) Administered   Influenza,inj,Quad PF,6+ Mos 10/25/2016, 09/21/2017, 08/17/2018, 12/02/2019, 08/10/2020, 09/14/2021   PFIZER(Purple Top)SARS-COV-2 Vaccination 01/21/2020, 02/11/2020, 08/25/2020, 12/30/2020   Td 10/29/2004   Tdap 11/02/2015   Zoster Recombinat (Shingrix) 12/21/2021, 03/15/2022   Patient Active Problem List   Diagnosis Date Noted   Cervical radiculopathy 09/20/2021   Elevated PSA 02/03/2020   Erectile dysfunction 04/27/2011   ALLERGIC RHINITIS 12/08/2008    Past Medical History:  Diagnosis Date   Allergic rhinitis    hay fever   Allergy    Colon polyp    GERD (gastroesophageal reflux  disease)    every 4 months or so    Past Surgical History:  Procedure Laterality Date   [place]ERVICAL [place] 10/29/2004   L4-5   MTP surgery  10/29/1996   1st, right, toe surgery    Family History  Problem Relation Age of Onset   Stroke Paternal Grandfather    Colon cancer Neg Hx    Esophageal cancer Neg Hx    Rectal cancer Neg Hx    Stomach cancer Neg Hx     Social History   Social History Narrative   Regular exercise- yes- golf, rare tennis    Past Medical History, Surgical History, Social History, Family History, Problem List, Medications, and Allergies have been reviewed and updated if relevant.  Review of Systems: Pertinent positives are listed above.  Otherwise, a full 14 point review of systems has been done in full and it is negative except where it is noted positive.  Objective:   There were no vitals taken for this visit. Ideal Body Weight:    Ideal Body Weight:   No results found.    12/21/2021   11:22 AM 12/05/2020    9:10 AM 12/02/2019    9:05 AM 10/30/2018    9:03 AM 10/23/2017    8:40 AM  Depression screen PHQ 2/9  Decreased Interest 0 0 0 0 0  Down, Depressed, Hopeless 0 0 0 0 0  PHQ - 2 Score 0 0 0 0 0     GEN: well developed,  well nourished, no acute distress Eyes: conjunctiva and lids normal, PERRLA, EOMI ENT: TM clear, nares clear, oral exam WNL Neck: supple, no lymphadenopathy, no thyromegaly, no JVD Pulm: clear to auscultation and percussion, respiratory effort normal CV: regular rate and rhythm, S1-S2, no murmur, rub or gallop, no bruits, peripheral pulses normal and symmetric, no cyanosis, clubbing, edema or varicosities GI: soft, non-tender; no hepatosplenomegaly, masses; active bowel sounds all quadrants GU: deferred Lymph: no cervical, axillary or inguinal adenopathy MSK: gait normal, muscle tone and strength WNL, no joint swelling, effusions, discoloration, crepitus  SKIN:  clear, good turgor, color WNL, no rashes, lesions, or ulcerations Neuro: normal mental status, normal strength, sensation, and motion Psych: alert; oriented to person, place and time, normally interactive and not anxious or depressed in appearance.  All labs reviewed with patient. Results for orders placed or performed in visit on 12/17/22  PSA, Total with Reflex to PSA, Free  Result Value Ref Range   PSA, Total 6.1 (H) < OR = 4.0 ng/mL  Lipid panel  Result Value Ref Range   Cholesterol 189 0 - 200 mg/dL   Triglycerides 184.0 (H) 0.0 - 149.0 mg/dL   HDL 53.70 >39.00 mg/dL   VLDL 36.8 0.0 - 40.0 mg/dL   LDL Cholesterol 99 0 - 99 mg/dL   Total CHOL/HDL Ratio 4    NonHDL 135.57   Hemoglobin A1c  Result Value Ref Range   Hgb A1c MFr Bld 5.8 4.6 - 6.5 %  Hepatic function panel  Result Value Ref Range   Total Bilirubin 0.6 0.2 - 1.2 mg/dL   Bilirubin, Direct 0.1 0.0 - 0.3 mg/dL   Alkaline Phosphatase 54 39 - 117 U/L   AST 17 0 - 37 U/L   ALT 23 0 - 53 U/L   Total Protein 7.0 6.0 - 8.3 g/dL   Albumin 4.2 3.5 - 5.2 g/dL  CBC with Differential/Platelet  Result Value Ref Range   WBC 8.8 4.0 - 10.5 K/uL   RBC 5.13 4.22 - 5.81 Mil/uL   Hemoglobin 15.6 13.0 - 17.0 g/dL   HCT 45.7 39.0 - 52.0 %   MCV 89.1 78.0 - 100.0 fl   MCHC 34.1 30.0 - 36.0 g/dL   RDW 12.9 11.5 - 15.5 %   Platelets 215.0 150.0 - 400.0 K/uL   Neutrophils Relative % 75.7 43.0 - 77.0 %   Lymphocytes Relative 15.1 12.0 - 46.0 %   Monocytes Relative 6.7 3.0 - 12.0 %   Eosinophils Relative 1.7 0.0 - 5.0 %   Basophils Relative 0.8 0.0 - 3.0 %   Neutro Abs 6.7 1.4 - 7.7 K/uL   Lymphs Abs 1.3 0.7 - 4.0 K/uL   Monocytes Absolute 0.6 0.1 - 1.0 K/uL   Eosinophils Absolute 0.2 0.0 - 0.7 K/uL   Basophils Absolute 0.1 0.0 - 0.1 K/uL  Basic metabolic panel  Result Value Ref Range   Sodium 136 135 - 145 mEq/L   Potassium 4.6 3.5 - 5.1 mEq/L   Chloride 100 96 - 112 mEq/L   CO2 27 19 - 32 mEq/L   Glucose, Bld 105 (H) 70 -  99 mg/dL   BUN 13 6 - 23 mg/dL   Creatinine, Ser 0.97 0.40 - 1.50 mg/dL   GFR 84.37 >60.00 mL/min   Calcium 9.5 8.4 - 10.5 mg/dL  reflex PSA, Free  Result Value Ref Range   PSA, Free 0.4 ng/mL   PSA, % Free 7 (L) >25 % (calc)    Assessment and Plan:  ICD-10-CM   1. Healthcare maintenance  Z00.00       Health Maintenance Exam: The patient's preventative maintenance and recommended screening tests for an annual wellness exam were reviewed in full today. Brought up to date unless services declined.  Counselled on the importance of diet, exercise, and its role in overall health and mortality. The patient's FH and SH was reviewed, including their home life, tobacco status, and drug and alcohol status.  Follow-up in 1 year for physical exam or additional follow-up below.  Disposition: No follow-ups on file.  No orders of the defined types were placed in this encounter.  There are no discontinued medications. No orders of the defined types were placed in this encounter.   Signed,  Maud Deed. Archana Eckman, MD   Allergies as of 12/24/2022       Reactions   Juniper Berries Itching   Latex Rash   Mixed Feathers Rash, Other (See Comments)   Red, swollen eyes    Tape Rash, Other (See Comments)   Blisters and bleeding        Medication List        Accurate as of December 23, 2022 11:35 AM. If you have any questions, ask your nurse or doctor.          fexofenadine 180 MG tablet Commonly known as: ALLEGRA Take 180 mg by mouth daily as needed.   Fish Oil 1000 MG Caps Take 1 capsule by mouth daily.   multivitamin tablet Take 1 tablet by mouth daily.

## 2022-12-24 ENCOUNTER — Ambulatory Visit (INDEPENDENT_AMBULATORY_CARE_PROVIDER_SITE_OTHER): Payer: 59 | Admitting: Family Medicine

## 2022-12-24 ENCOUNTER — Encounter: Payer: Self-pay | Admitting: Family Medicine

## 2022-12-24 VITALS — BP 120/70 | HR 77 | Temp 97.8°F | Ht 69.5 in | Wt 220.0 lb

## 2022-12-24 DIAGNOSIS — Z0000 Encounter for general adult medical examination without abnormal findings: Secondary | ICD-10-CM | POA: Diagnosis not present

## 2022-12-24 DIAGNOSIS — R7303 Prediabetes: Secondary | ICD-10-CM | POA: Diagnosis not present

## 2022-12-25 DIAGNOSIS — R7303 Prediabetes: Secondary | ICD-10-CM | POA: Insufficient documentation

## 2022-12-28 ENCOUNTER — Other Ambulatory Visit: Payer: 59

## 2023-01-03 ENCOUNTER — Ambulatory Visit: Payer: 59 | Admitting: Urology

## 2023-01-11 ENCOUNTER — Ambulatory Visit (INDEPENDENT_AMBULATORY_CARE_PROVIDER_SITE_OTHER): Payer: 59 | Admitting: Urology

## 2023-01-11 ENCOUNTER — Encounter: Payer: Self-pay | Admitting: Urology

## 2023-01-11 VITALS — BP 138/79 | HR 72 | Ht 69.5 in | Wt 218.0 lb

## 2023-01-11 DIAGNOSIS — R972 Elevated prostate specific antigen [PSA]: Secondary | ICD-10-CM | POA: Diagnosis not present

## 2023-01-11 NOTE — Progress Notes (Signed)
I, DeAsia L Maxie,acting as a scribe for Abbie Sons, MD.,have documented all relevant documentation on the behalf of Abbie Sons, MD,as directed by  Abbie Sons, MD while in the presence of Abbie Sons, MD.   I, Gery Pray Plume,acting as a scribe for Abbie Sons, MD.,have documented all relevant documentation on the behalf of Abbie Sons, MD,as directed by  Abbie Sons, MD while in the presence of Abbie Sons, MD.  01/11/2023 10:42 AM   Jason Coffey 01/30/1961 [id number]  Referring provider: Owens Loffler, MD [address] [place],  [place] [address]  Chief Complaint  Patient presents with   Elevated PSA    Urologic history: 1. elevated PSA -Biopsy 03/2018; PSA 5.58 (4K 24%); 50 g prostate; benign pathology -Prostate MRI in 03/29/2020; 30 cc gland; no suspicious lesions, PI-RADS 1   HPI: 62 y.o. male presents for annual follow-up.  No bothersome LUTS Denies dysuria, gross hematuria Denies flank, abdominal or pelvic pain Most recent PSA on 12/17/2022 was 6.1  9/14     2.46 10/15   2.67  12/16   3.4 12/17   4.6 2/18     3.3 12/18   5.58 2/19     4.5 5/19     4.18 (4K 24%) 12/19   8.0 1/21     6.7 1/22     6.8 7/22     7.1 2/23     6.1 2/24     6.1  PMH: Past Medical History:  Diagnosis Date   Allergic rhinitis    hay fever   Colon polyp    GERD (gastroesophageal reflux disease)    every 4 months or so    Surgical History: Past Surgical History:  Procedure Laterality Date   [place] SURGERY  10/29/2004   L4-5   MTP surgery  10/29/1996   1st, right, toe surgery    Home Medications:  Allergies as of 01/11/2023       Reactions   Juniper Berries Itching   Latex Rash   Mixed Feathers Rash, Other (See Comments)   Red, swollen eyes    Tape Rash, Other (See Comments)   Blisters and bleeding        Medication List        Accurate as of January 11, 2023 10:42 AM. If you have any questions, ask your nurse or doctor.          fexofenadine 180 MG tablet Commonly known as: ALLEGRA Take 180 mg by mouth daily.   Fish Oil 1000 MG Caps Take 1 capsule by mouth daily.   multivitamin tablet Take 1 tablet by mouth daily.        Allergies:  Allergies  Allergen Reactions   Juniper Berries Itching   Latex Rash   Mixed Feathers Rash and Other (See Comments)    Red, swollen eyes    Tape Rash and Other (See Comments)    Blisters and bleeding    Family History: Family History  Problem Relation Age of Onset   Stroke Paternal Grandfather    Colon cancer Neg Hx    Esophageal cancer Neg Hx    Rectal cancer Neg Hx    Stomach cancer Neg Hx     Social History:  reports that he quit smoking about 35 years ago. His smoking use included cigarettes. He has never used  smokeless tobacco. He reports current alcohol use of about 14.0 standard drinks of alcohol per week. He reports that he does not use drugs.   Physical Exam: BP 138/79   Pulse 72   Ht 5' 9.5" (1.765 m)   Wt 218 lb (98.9 kg)   BMI 31.73 kg/m   Constitutional:  Alert and oriented, No acute distress. HEENT: [place] AT Respiratory: Normal respiratory effort, no increased work of breathing. GU: Prostate 40 g, smooth without nodules Skin: No rashes, bruises or suspicious lesions. Neurologic: Grossly intact, no focal deficits, moving all 4 extremities. Psychiatric: Normal mood and affect.  Assessment & Plan:    1.  Elevated PSA Stable Benign DRE Continue annual f/u with DRE PSA checked by his PCP  I have reviewed the above documentation for accuracy and completeness, and I agree with the above.    Abbie Sons, [place] [address], [place], [place] [address] [phone]

## 2023-09-18 ENCOUNTER — Encounter: Payer: Self-pay | Admitting: Family Medicine

## 2023-09-19 NOTE — Telephone Encounter (Signed)
Form completed and placed in Dr. Cyndie Chime office in box to complete.

## 2023-09-19 NOTE — Telephone Encounter (Signed)
Completed form faxed to [phone].

## 2023-11-21 ENCOUNTER — Telehealth: Payer: Self-pay | Admitting: *Deleted

## 2023-11-21 DIAGNOSIS — R5383 Other fatigue: Secondary | ICD-10-CM

## 2023-11-21 DIAGNOSIS — Z13220 Encounter for screening for lipoid disorders: Secondary | ICD-10-CM

## 2023-11-21 DIAGNOSIS — R7303 Prediabetes: Secondary | ICD-10-CM

## 2023-11-21 DIAGNOSIS — R972 Elevated prostate specific antigen [PSA]: Secondary | ICD-10-CM

## 2023-11-21 NOTE — Telephone Encounter (Signed)
-----   Message from Alvina Chou sent at 11/21/2023 10:58 AM EST ----- Regarding: Lab orders for [facility], 2.13.25 Patient is scheduled for CPX labs, please order future labs, Thanks , Camelia Eng

## 2023-12-12 ENCOUNTER — Other Ambulatory Visit (INDEPENDENT_AMBULATORY_CARE_PROVIDER_SITE_OTHER): Payer: 59

## 2023-12-12 DIAGNOSIS — Z13220 Encounter for screening for lipoid disorders: Secondary | ICD-10-CM | POA: Diagnosis not present

## 2023-12-12 DIAGNOSIS — R7303 Prediabetes: Secondary | ICD-10-CM

## 2023-12-12 DIAGNOSIS — R972 Elevated prostate specific antigen [PSA]: Secondary | ICD-10-CM

## 2023-12-12 DIAGNOSIS — R5383 Other fatigue: Secondary | ICD-10-CM | POA: Diagnosis not present

## 2023-12-12 LAB — BASIC METABOLIC PANEL
BUN: 14 mg/dL (ref 6–23)
CO2: 29 meq/L (ref 19–32)
Calcium: 9.2 mg/dL (ref 8.4–10.5)
Chloride: 101 meq/L (ref 96–112)
Creatinine, Ser: 0.85 mg/dL (ref 0.40–1.50)
GFR: 93.27 mL/min (ref >60.00)
Glucose, Bld: 97 mg/dL (ref 70–99)
Potassium: 4.2 meq/L (ref 3.5–5.1)
Sodium: 138 meq/L (ref 135–145)

## 2023-12-12 LAB — CBC WITH DIFFERENTIAL/PLATELET
Basophils Absolute: 0.1 10*3/uL (ref 0.0–0.1)
Basophils Relative: 0.8 % (ref 0.0–3.0)
Eosinophils Absolute: 0.2 10*3/uL (ref 0.0–0.7)
Eosinophils Relative: 2.5 % (ref 0.0–5.0)
HCT: 43.9 % (ref 39.0–52.0)
Hemoglobin: 14.6 g/dL (ref 13.0–17.0)
Lymphocytes Relative: 27 % (ref 12.0–46.0)
Lymphs Abs: 1.8 10*3/uL (ref 0.7–4.0)
MCHC: 33.2 g/dL (ref 30.0–36.0)
MCV: 88.9 fL (ref 78.0–100.0)
Monocytes Absolute: 0.6 10*3/uL (ref 0.1–1.0)
Monocytes Relative: 8.3 % (ref 3.0–12.0)
Neutro Abs: 4.1 10*3/uL (ref 1.4–7.7)
Neutrophils Relative %: 61.4 % (ref 43.0–77.0)
Platelets: 227 10*3/uL (ref 150.0–400.0)
RBC: 4.94 Mil/uL (ref 4.22–5.81)
RDW: 13 % (ref 11.5–15.5)
WBC: 6.7 10*3/uL (ref 4.0–10.5)

## 2023-12-12 LAB — LIPID PANEL
Cholesterol: 176 mg/dL (ref 0–200)
HDL: 50.9 mg/dL (ref >39.00)
LDL Cholesterol: 99 mg/dL (ref 0–99)
NonHDL: 124.61
Total CHOL/HDL Ratio: 3
Triglycerides: 127 mg/dL (ref 0.0–149.0)
VLDL: 25.4 mg/dL (ref 0.0–40.0)

## 2023-12-12 LAB — HEMOGLOBIN A1C: Hgb A1c MFr Bld: 5.8 % (ref 4.6–6.5)

## 2023-12-12 LAB — HEPATIC FUNCTION PANEL
ALT: 26 U/L (ref 0–53)
AST: 15 U/L (ref 0–37)
Albumin: 4.2 g/dL (ref 3.5–5.2)
Alkaline Phosphatase: 54 U/L (ref 39–117)
Bilirubin, Direct: 0.2 mg/dL (ref 0.0–0.3)
Total Bilirubin: 0.7 mg/dL (ref 0.2–1.2)
Total Protein: 6.6 g/dL (ref 6.0–8.3)

## 2023-12-16 LAB — PSA, TOTAL WITH REFLEX TO PSA, FREE: PSA, Total: 6.2 ng/mL — ABNORMAL HIGH (ref <=4.0)

## 2023-12-16 LAB — REFLEX PSA, FREE
PSA, % Free: 10 % — ABNORMAL LOW (ref >25)
PSA, Free: 0.6 ng/mL

## 2023-12-18 ENCOUNTER — Encounter: Payer: 59 | Admitting: Family Medicine

## 2023-12-29 NOTE — Progress Notes (Unsigned)
 Shontel Santee T. Emmelia Holdsworth, MD, CAQ Sports Medicine [facility] at [facility] [address] [place], [address]  Phone: [phone]  FAX: [phone]  Lavon Bothwell - 63 y.o. male  MRN [id number]  Date of Birth: [birth year]  Date: 12/30/2023  PCP: Hannah Beat, MD  Referral: Hannah Beat, MD  No chief complaint on file.  Patient Care Team: Hannah Beat, MD as PCP - General Subjective:   Cuong Moorman is a 62 y.o. pleasant patient who presents with the following:  Preventative Health Maintenance Visit:  Health Maintenance Summary Reviewed and updated, unless pt declines services.  Tobacco History Reviewed. Alcohol: No concerns, no excessive use Exercise Habits: Some activity, rec at least 30 mins 5 times a week STD concerns: no risk or activity to increase risk Drug Use: None  Kamani is a generally healthy patient, and the presents today for follow-up and complete physical exam.  PSA elevated - see Dr. Lonna Cobb (12/2023 upcoming appointment) 2019 biopsy neg, 2021 Prostate MRI not suspicious    Health Maintenance  Topic Date Due   COVID-19 Vaccine (9 - 2024-25 season) 01/14/2024 (Originally 10/24/2023)   DTaP/Tdap/Td (3 - Td or Tdap) 11/01/2025   Colonoscopy  08/13/2029   INFLUENZA VACCINE  Completed   Hepatitis C Screening  Completed   HIV Screening  Completed   Zoster Vaccines- Shingrix  Completed   HPV VACCINES  Aged Out   Immunization History  Administered Date(s) Administered   Influenza Inj Mdck Quad Pf 08/06/2022   Influenza,inj,Quad PF,6+ Mos 10/25/2016, 09/21/2017, 08/17/2018, 12/02/2019, 08/10/2020, 09/14/2021   Moderna Covid-19 Fall Seasonal Vaccine 71yrs & older 10/01/2022, 08/29/2023   PFIZER Comirnaty(Gray Top)Covid-19 Tri-Sucrose Vaccine 03/01/2021   PFIZER(Purple Top)SARS-COV-2 Vaccination 01/21/2020, 02/11/2020, 08/25/2020, 12/30/2020   Pfizer Covid-19 Vaccine Bivalent Booster 23yrs & up 12/22/2021   Td 10/29/2004    Tdap 11/02/2015   Zoster Recombinant(Shingrix) 12/21/2021, 03/15/2022   Patient Active Problem List   Diagnosis Date Noted   Prediabetes 12/25/2022    Priority: Medium    Elevated PSA 02/03/2020    Priority: Medium    Cervical radiculopathy 09/20/2021   Erectile dysfunction 04/27/2011   Allergic rhinitis 12/08/2008    Past Medical History:  Diagnosis Date   Allergic rhinitis    hay fever   Colon polyp    GERD (gastroesophageal reflux disease)    every 4 months or so    Past Surgical History:  Procedure Laterality Date   CERVICAL DISC SURGERY     INGUINAL HERNIA REPAIR  1962   LUMBAR DISC SURGERY  10/29/2004   L4-5   MTP surgery  10/29/1996   1st, right, toe surgery    Family History  Problem Relation Age of Onset   Stroke Paternal Grandfather    Colon cancer Neg Hx    Esophageal cancer Neg Hx    Rectal cancer Neg Hx    Stomach cancer Neg Hx     Social History   Social History Narrative   Regular exercise- yes- golf, rare tennis    Past Medical History, Surgical History, Social History, Family History, Problem List, Medications, and Allergies have been reviewed and updated if relevant.  Review of Systems: Pertinent positives are listed above.  Otherwise, a full 14 point review of systems has been done in full and it is negative except where it is noted positive.  Objective:   There were no vitals taken for this visit. Ideal Body Weight:    Ideal Body Weight:   No results found.  12/24/2022   10:22 AM 12/21/2021   11:22 AM 12/05/2020    9:10 AM 12/02/2019    9:05 AM 10/30/2018    9:03 AM  Depression screen PHQ 2/9  Decreased Interest 0 0 0 0 0  Down, Depressed, Hopeless 0 0 0 0 0  PHQ - 2 Score 0 0 0 0 0     GEN: well developed, well nourished, no acute distress Eyes: conjunctiva and lids normal, PERRLA, EOMI ENT: TM clear, nares clear, oral exam WNL Neck: supple, no lymphadenopathy, no thyromegaly, no JVD Pulm: clear to auscultation and  percussion, respiratory effort normal CV: regular rate and rhythm, S1-S2, no murmur, rub or gallop, no bruits, peripheral pulses normal and symmetric, no cyanosis, clubbing, edema or varicosities GI: soft, non-tender; no hepatosplenomegaly, masses; active bowel sounds all quadrants GU: deferred Lymph: no cervical, axillary or inguinal adenopathy MSK: gait normal, muscle tone and strength WNL, no joint swelling, effusions, discoloration, crepitus  SKIN: clear, good turgor, color WNL, no rashes, lesions, or ulcerations Neuro: normal mental status, normal strength, sensation, and motion Psych: alert; oriented to person, place and time, normally interactive and not anxious or depressed in appearance.  All labs reviewed with patient. Results for orders placed or performed in visit on 12/12/23  PSA, Total with Reflex to PSA, Free   Collection Time: 12/12/23  7:49 AM  Result Value Ref Range   PSA, Total 6.2 (H) < OR = 4.0 ng/mL  Hepatic Function Panel   Collection Time: 12/12/23  7:49 AM  Result Value Ref Range   Total Bilirubin 0.7 0.2 - 1.2 mg/dL   Bilirubin, Direct 0.2 0.0 - 0.3 mg/dL   Alkaline Phosphatase 54 39 - 117 U/L   AST 15 0 - 37 U/L   ALT 26 0 - 53 U/L   Total Protein 6.6 6.0 - 8.3 g/dL   Albumin 4.2 3.5 - 5.2 g/dL  Basic metabolic panel   Collection Time: 12/12/23  7:49 AM  Result Value Ref Range   Sodium 138 135 - 145 mEq/L   Potassium 4.2 3.5 - 5.1 mEq/L   Chloride 101 96 - 112 mEq/L   CO2 29 19 - 32 mEq/L   Glucose, Bld 97 70 - 99 mg/dL   BUN 14 6 - 23 mg/dL   Creatinine, Ser 9.81 0.40 - 1.50 mg/dL   GFR 19.14 >78.29 mL/min   Calcium 9.2 8.4 - 10.5 mg/dL  CBC with Differential/Platelet   Collection Time: 12/12/23  7:49 AM  Result Value Ref Range   WBC 6.7 4.0 - 10.5 K/uL   RBC 4.94 4.22 - 5.81 Mil/uL   Hemoglobin 14.6 13.0 - 17.0 g/dL   HCT 56.2 13.0 - 86.5 %   MCV 88.9 78.0 - 100.0 fl   MCHC 33.2 30.0 - 36.0 g/dL   RDW 78.4 69.6 - 29.5 %   Platelets 227.0  150.0 - 400.0 K/uL   Neutrophils Relative % 61.4 43.0 - 77.0 %   Lymphocytes Relative 27.0 12.0 - 46.0 %   Monocytes Relative 8.3 3.0 - 12.0 %   Eosinophils Relative 2.5 0.0 - 5.0 %   Basophils Relative 0.8 0.0 - 3.0 %   Neutro Abs 4.1 1.4 - 7.7 K/uL   Lymphs Abs 1.8 0.7 - 4.0 K/uL   Monocytes Absolute 0.6 0.1 - 1.0 K/uL   Eosinophils Absolute 0.2 0.0 - 0.7 K/uL   Basophils Absolute 0.1 0.0 - 0.1 K/uL  Lipid panel   Collection Time: 12/12/23  7:49 AM  Result Value Ref Range   Cholesterol 176 0 - 200 mg/dL   Triglycerides 161.0 0.0 - 149.0 mg/dL   HDL 96.04 >54.09 mg/dL   VLDL 81.1 0.0 - 91.4 mg/dL   LDL Cholesterol 99 0 - 99 mg/dL   Total CHOL/HDL Ratio 3    NonHDL 124.61   Hemoglobin A1c   Collection Time: 12/12/23  7:49 AM  Result Value Ref Range   Hgb A1c MFr Bld 5.8 4.6 - 6.5 %  reflex PSA, Free   Collection Time: 12/12/23  7:49 AM  Result Value Ref Range   PSA, Free 0.6 ng/mL   PSA, % Free 10 (L) >25 % (calc)    Assessment and Plan:     ICD-10-CM   1. Healthcare maintenance  Z00.00       Health Maintenance Exam: The patient's preventative maintenance and recommended screening tests for an annual wellness exam were reviewed in full today. Brought up to date unless services declined.  Counselled on the importance of diet, exercise, and its role in overall health and mortality. The patient's FH and SH was reviewed, including their home life, tobacco status, and drug and alcohol status.  Follow-up in 1 year for physical exam or additional follow-up below.  Disposition: No follow-ups on file.  No orders of the defined types were placed in this encounter.  There are no discontinued medications. No orders of the defined types were placed in this encounter.   Signed,  Elpidio Galea. Azaiah Mello, MD   Allergies as of 12/30/2023       Reactions   Juniper Berries Itching   Latex Rash   Mixed Feathers Rash, Other (See Comments)   Red, swollen eyes    Tape Rash, Other  (See Comments)   Blisters and bleeding        Medication List        Accurate as of December 29, 2023 11:52 AM. If you have any questions, ask your nurse or doctor.          fexofenadine 180 MG tablet Commonly known as: ALLEGRA Take 180 mg by mouth daily.   Fish Oil 1000 MG Caps Take 1 capsule by mouth daily.   multivitamin tablet Take 1 tablet by mouth daily.

## 2023-12-30 ENCOUNTER — Ambulatory Visit (INDEPENDENT_AMBULATORY_CARE_PROVIDER_SITE_OTHER): Payer: 59 | Admitting: Family Medicine

## 2023-12-30 ENCOUNTER — Encounter: Payer: Self-pay | Admitting: Family Medicine

## 2023-12-30 VITALS — BP 112/70 | HR 74 | Temp 97.7°F | Ht 69.0 in | Wt 201.1 lb

## 2023-12-30 DIAGNOSIS — Z0000 Encounter for general adult medical examination without abnormal findings: Secondary | ICD-10-CM | POA: Diagnosis not present

## 2024-01-10 ENCOUNTER — Ambulatory Visit (INDEPENDENT_AMBULATORY_CARE_PROVIDER_SITE_OTHER): Payer: 59 | Admitting: Urology

## 2024-01-10 ENCOUNTER — Encounter: Payer: Self-pay | Admitting: Urology

## 2024-01-10 VITALS — BP 130/74 | HR 76 | Ht 69.0 in | Wt 201.0 lb

## 2024-01-10 DIAGNOSIS — R972 Elevated prostate specific antigen [PSA]: Secondary | ICD-10-CM

## 2024-01-10 NOTE — Progress Notes (Signed)
 I, [name], acting as a scribe for Riki Altes, MD., have documented all relevant documentation on the behalf of Riki Altes, MD, as directed by Riki Altes, MD while in the presence of Riki Altes, MD.  01/10/2024 10:43 AM   Jason Coffey [birth year] [id number]  Referring provider: Hannah Beat, MD [address] [place],  [place] [address]  Chief Complaint  Patient presents with   Elevated PSA   Urologic history: 1. Elevated PSA Biopsy 03/2018; PSA 5.58 (4K 24%); 50 g prostate; benign pathology Prostate MRI in 03/29/2020 for PSA of 6.7  HPI: Jason Coffey is a 63 y.o. male presents for a follow-up office visit.   Denies dysuria, gross hematuria Denies flank, abdominal or pelvic pain PSA 12/12/23 stable 6.2  PSA trend   PSA, Total  Latest Ref Rng < OR = 4.0 ng/mL  11/19/2019 6.7 (H)   12/14/2021 6.1 (H)   12/17/2022 6.1 (H)   12/12/2023 6.2 (H)      PMH: Past Medical History:  Diagnosis Date   Allergic rhinitis    hay fever   Colon polyp    GERD (gastroesophageal reflux disease)     Surgical History: Past Surgical History:  Procedure Laterality Date   CERVICAL DISC SURGERY     INGUINAL HERNIA REPAIR  1962   LUMBAR DISC SURGERY  10/29/2004   L4-5   MTP surgery  10/29/1996   1st, right, toe surgery    Home Medications:  Allergies as of 01/10/2024       Reactions   Juniper Berries Itching   [place]hey Diarrhea   Latex Rash   Mixed Feathers Rash, Other (See Comments)   Red, swollen eyes    Tape Rash, Other (See Comments)   Blisters and bleeding        Medication List        Accurate as of January 10, 2024 10:43 AM. If you have any questions, ask your nurse or doctor.          fexofenadine 180 MG tablet Commonly known as: ALLEGRA Take 180 mg by mouth daily.   Fish Oil 1000 MG Caps Take 1 capsule by mouth daily.   meloxicam 15 MG tablet Commonly known as: MOBIC Take 15 mg by mouth as needed.   multivitamin  tablet Take 1 tablet by mouth daily.        Allergies:  Allergies  Allergen Reactions   Juniper Berries Itching   Whey Diarrhea   Latex Rash   Mixed Feathers Rash and Other (See Comments)    Red, swollen eyes    Tape Rash and Other (See Comments)    Blisters and bleeding    Family History: Family History  Problem Relation Age of Onset   Stroke Paternal Grandfather    Colon cancer Neg Hx    Esophageal cancer Neg Hx    Rectal cancer Neg Hx    Stomach cancer Neg Hx     Social History:  reports that he quit smoking about 36 years ago. His smoking use included cigarettes. He has never used smokeless tobacco. He reports current alcohol use of about 14.0 standard drinks of alcohol per week. He reports that he does not use drugs.   Physical Exam: BP 130/74   Pulse 76   Ht 5\' 9"  (1.753 m)   Wt 201 lb (91.2 kg)   BMI 29.68 kg/m   Constitutional:  Alert and oriented, No acute distress. HEENT: [place] AT  Respiratory: Normal respiratory effort, no increased work of breathing. GU: Prostate 45 grams, smooth without nodules.  Psychiatric: Normal mood and affect.  Assessment & Plan:    1. Elevated PSA Stable Benign DRE He has requested interim 6 month PSA for peace of mind.  I have reviewed the above documentation for accuracy and completeness, and I agree with the above.   Riki Altes, MD  [facility] [address] [place], [place] [address] [phone]

## 2024-07-13 ENCOUNTER — Other Ambulatory Visit

## 2024-07-13 DIAGNOSIS — R972 Elevated prostate specific antigen [PSA]: Secondary | ICD-10-CM

## 2024-07-14 LAB — PSA: Prostate Specific Ag, Serum: 5.3 ng/mL — ABNORMAL HIGH (ref 0.0–4.0)

## 2024-12-25 ENCOUNTER — Other Ambulatory Visit

## 2024-12-30 ENCOUNTER — Encounter: Admitting: Family Medicine

## 2025-01-15 ENCOUNTER — Ambulatory Visit: Admitting: Urology
